# Patient Record
Sex: Male | Born: 1960 | Race: White | Hispanic: No | Marital: Married | Smoking: Current every day smoker
Health system: Southern US, Community
[De-identification: ages and names within clinical notes are randomized; demographics above are authoritative.]

## PROBLEM LIST (undated history)

## (undated) DIAGNOSIS — I10 Essential (primary) hypertension: Secondary | ICD-10-CM

## (undated) DIAGNOSIS — K219 Gastro-esophageal reflux disease without esophagitis: Secondary | ICD-10-CM

## (undated) DIAGNOSIS — Z720 Tobacco use: Secondary | ICD-10-CM

## (undated) DIAGNOSIS — T4145XA Adverse effect of unspecified anesthetic, initial encounter: Secondary | ICD-10-CM

## (undated) DIAGNOSIS — T8859XA Other complications of anesthesia, initial encounter: Secondary | ICD-10-CM

## (undated) DIAGNOSIS — I2510 Atherosclerotic heart disease of native coronary artery without angina pectoris: Secondary | ICD-10-CM

## (undated) DIAGNOSIS — E785 Hyperlipidemia, unspecified: Secondary | ICD-10-CM

## (undated) DIAGNOSIS — E119 Type 2 diabetes mellitus without complications: Secondary | ICD-10-CM

## (undated) DIAGNOSIS — Z9289 Personal history of other medical treatment: Secondary | ICD-10-CM

## (undated) HISTORY — DX: Hyperlipidemia, unspecified: E78.5

## (undated) HISTORY — DX: Personal history of other medical treatment: Z92.89

## (undated) HISTORY — PX: NASAL SEPTUM SURGERY: SHX37

---

## 1998-07-03 ENCOUNTER — Encounter: Admission: RE | Admit: 1998-07-03 | Discharge: 1998-10-01 | Payer: Self-pay | Admitting: Neurosurgery

## 2000-09-11 ENCOUNTER — Encounter: Payer: Self-pay | Admitting: Urology

## 2000-09-11 ENCOUNTER — Encounter: Admission: RE | Admit: 2000-09-11 | Discharge: 2000-09-11 | Payer: Self-pay | Admitting: Urology

## 2000-10-30 ENCOUNTER — Encounter: Admission: RE | Admit: 2000-10-30 | Discharge: 2001-01-28 | Payer: Self-pay | Admitting: *Deleted

## 2003-04-01 ENCOUNTER — Encounter: Admission: RE | Admit: 2003-04-01 | Discharge: 2003-06-30 | Payer: Self-pay | Admitting: Internal Medicine

## 2009-12-05 HISTORY — PX: BACK SURGERY: SHX140

## 2010-02-02 ENCOUNTER — Inpatient Hospital Stay (HOSPITAL_COMMUNITY): Admission: RE | Admit: 2010-02-02 | Discharge: 2010-02-04 | Payer: Self-pay | Admitting: Neurosurgery

## 2011-02-24 LAB — BASIC METABOLIC PANEL
BUN: 8 mg/dL (ref 6–23)
CO2: 29 mEq/L (ref 19–32)
Calcium: 9.4 mg/dL (ref 8.4–10.5)
Chloride: 104 mEq/L (ref 96–112)
Creatinine, Ser: 0.97 mg/dL (ref 0.4–1.5)
GFR calc Af Amer: >60 mL/min (ref >60)
GFR calc non Af Amer: >60 mL/min (ref >60)
Glucose, Bld: 148 mg/dL — ABNORMAL HIGH (ref 70–99)
Potassium: 4.7 mEq/L (ref 3.5–5.1)
Sodium: 140 mEq/L (ref 135–145)

## 2011-02-24 LAB — CBC
HCT: 47.9 % (ref 39.0–52.0)
Hemoglobin: 16.6 g/dL (ref 13.0–17.0)
MCHC: 34.7 g/dL (ref 30.0–36.0)
MCV: 85.6 fL (ref 78.0–100.0)
Platelets: 279 10*3/uL (ref 150–400)
RBC: 5.6 MIL/uL (ref 4.22–5.81)
RDW: 12.6 % (ref 11.5–15.5)
WBC: 7.7 10*3/uL (ref 4.0–10.5)

## 2011-02-24 LAB — SURGICAL PCR SCREEN
MRSA, PCR: NEGATIVE
Staphylococcus aureus: POSITIVE — AB

## 2011-02-28 LAB — GLUCOSE, CAPILLARY
Glucose-Capillary: 139 mg/dL — ABNORMAL HIGH (ref 70–99)
Glucose-Capillary: 208 mg/dL — ABNORMAL HIGH (ref 70–99)
Glucose-Capillary: 221 mg/dL — ABNORMAL HIGH (ref 70–99)
Glucose-Capillary: 240 mg/dL — ABNORMAL HIGH (ref 70–99)
Glucose-Capillary: 255 mg/dL — ABNORMAL HIGH (ref 70–99)
Glucose-Capillary: 271 mg/dL — ABNORMAL HIGH (ref 70–99)
Glucose-Capillary: 305 mg/dL — ABNORMAL HIGH (ref 70–99)

## 2011-03-26 IMAGING — CR DG CERVICAL SPINE 2 OR 3 VIEWS
1 series · 1 of 1 positions shown · non-contrast
Comparison: None.

CLINICAL DATA: Cervical stenosis with neck pain.  C3-4, C5-6, C6-7
ACDF.

CERVICAL SPINE - 2-3 VIEW

[view not recorded]
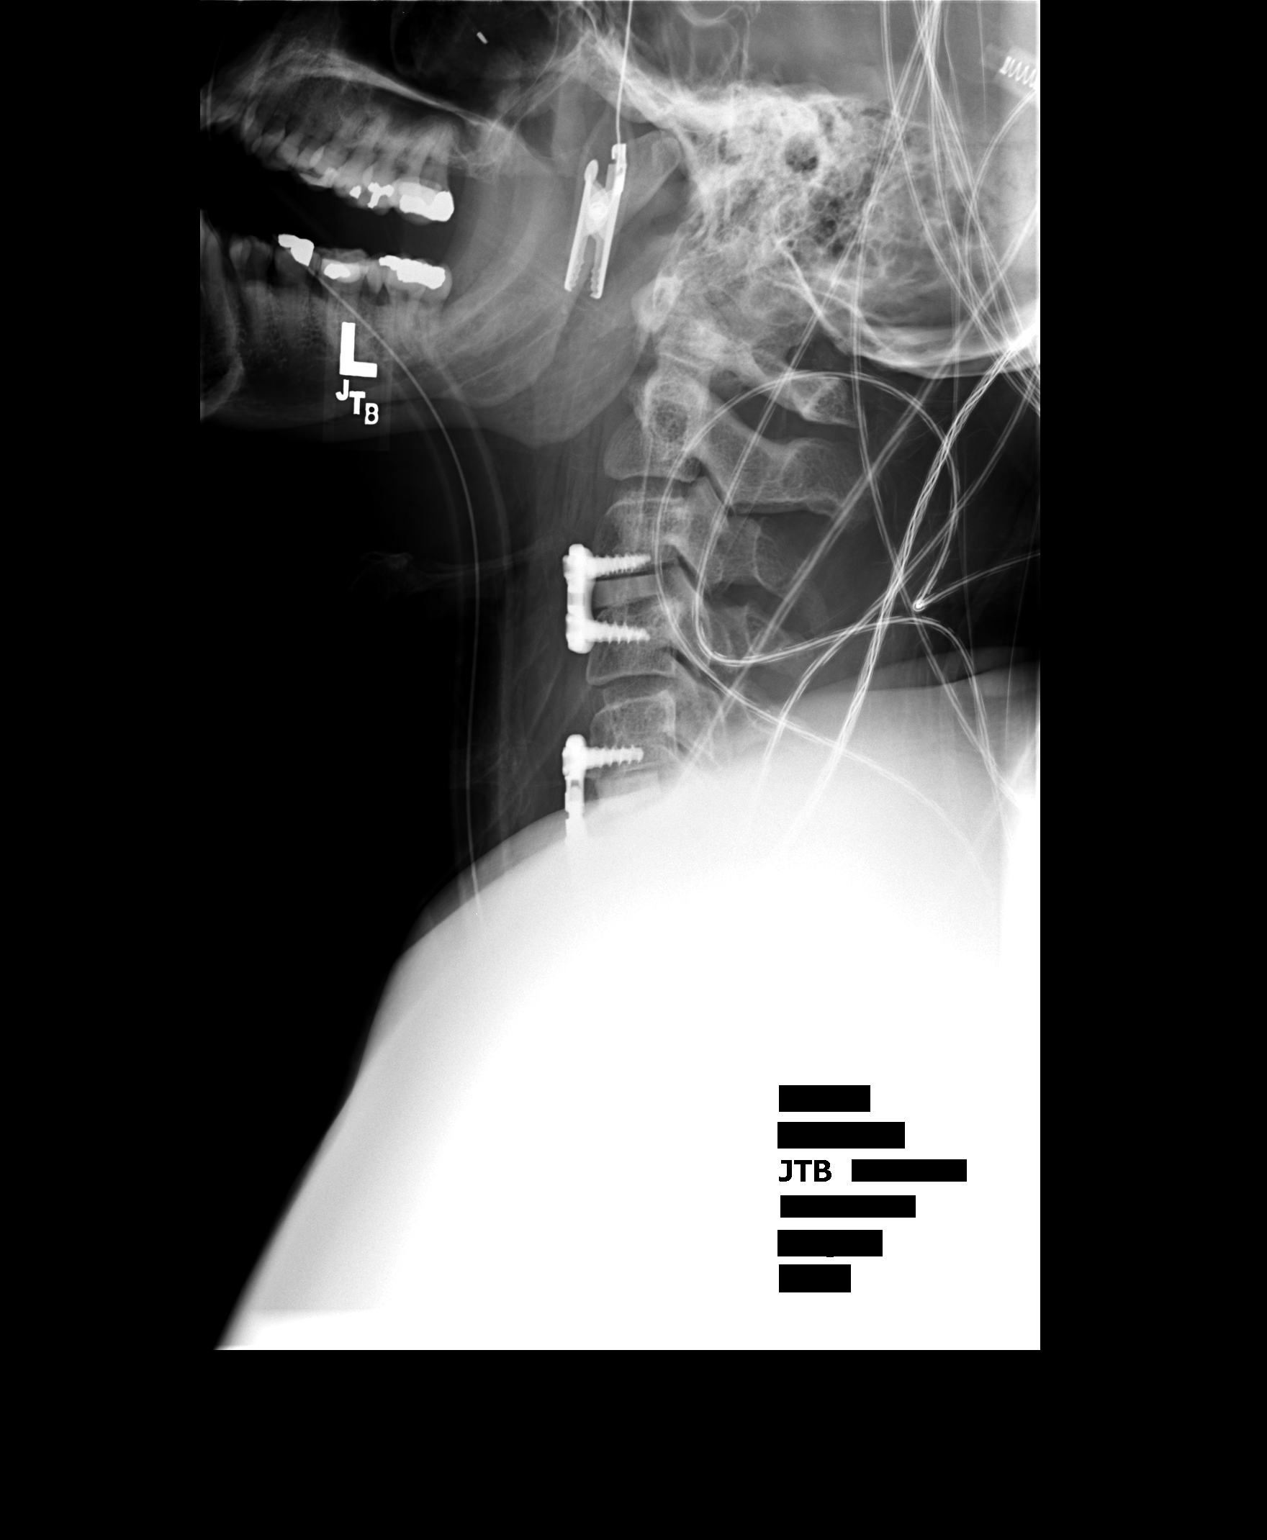

[1 of 1 positions shown; findings below may reference images not displayed]

FINDINGS: Film labeled #1 demonstrates anterior spinal needles at
the anterior C3-4 and C5-6 disc levels.  Film labeled #2
demonstrates interval anterior discectomy interbody bone plugs and
anterior fusion hardware in satisfactory placement  at the C3-4 and
C5-6(C7 hardware and C6-7 interbody bone plug difficult to
visualized due to the shoulder girdle.)
IMPRESSION: Localization and ACDF as described.

## 2014-08-15 ENCOUNTER — Observation Stay (HOSPITAL_BASED_OUTPATIENT_CLINIC_OR_DEPARTMENT_OTHER)
Admission: EM | Admit: 2014-08-15 | Discharge: 2014-08-16 | Disposition: A | Discharge status: Home / Self Care | Payer: 59 | Attending: Internal Medicine | Admitting: Internal Medicine

## 2014-08-15 ENCOUNTER — Emergency Department (HOSPITAL_BASED_OUTPATIENT_CLINIC_OR_DEPARTMENT_OTHER): Payer: 59

## 2014-08-15 ENCOUNTER — Encounter (HOSPITAL_BASED_OUTPATIENT_CLINIC_OR_DEPARTMENT_OTHER): Payer: Self-pay | Admitting: Emergency Medicine

## 2014-08-15 DIAGNOSIS — Z794 Long term (current) use of insulin: Secondary | ICD-10-CM | POA: Diagnosis not present

## 2014-08-15 DIAGNOSIS — Z882 Allergy status to sulfonamides status: Secondary | ICD-10-CM | POA: Diagnosis not present

## 2014-08-15 DIAGNOSIS — Z7982 Long term (current) use of aspirin: Secondary | ICD-10-CM | POA: Diagnosis not present

## 2014-08-15 DIAGNOSIS — E860 Dehydration: Principal | ICD-10-CM | POA: Insufficient documentation

## 2014-08-15 DIAGNOSIS — E1010 Type 1 diabetes mellitus with ketoacidosis without coma: Secondary | ICD-10-CM | POA: Insufficient documentation

## 2014-08-15 DIAGNOSIS — F17200 Nicotine dependence, unspecified, uncomplicated: Secondary | ICD-10-CM | POA: Insufficient documentation

## 2014-08-15 DIAGNOSIS — E785 Hyperlipidemia, unspecified: Secondary | ICD-10-CM | POA: Diagnosis not present

## 2014-08-15 DIAGNOSIS — I10 Essential (primary) hypertension: Secondary | ICD-10-CM | POA: Diagnosis not present

## 2014-08-15 HISTORY — DX: Type 2 diabetes mellitus without complications: E11.9

## 2014-08-15 HISTORY — DX: Essential (primary) hypertension: I10

## 2014-08-15 LAB — COMPREHENSIVE METABOLIC PANEL
ALBUMIN: 4.1 g/dL (ref 3.5–5.2)
ALK PHOS: 136 U/L — AB (ref 39–117)
ALT: 58 U/L — AB (ref 0–53)
AST: 40 U/L — ABNORMAL HIGH (ref 0–37)
Anion gap: 28 — ABNORMAL HIGH (ref 5–15)
BUN: 21 mg/dL (ref 6–23)
CO2: 13 mEq/L — ABNORMAL LOW (ref 19–32)
Calcium: 10.2 mg/dL (ref 8.4–10.5)
Chloride: 91 mEq/L — ABNORMAL LOW (ref 96–112)
Creatinine, Ser: 0.9 mg/dL (ref 0.50–1.35)
GFR calc Af Amer: >90 mL/min (ref >90)
GFR calc non Af Amer: >90 mL/min (ref >90)
GLUCOSE: 417 mg/dL — AB (ref 70–99)
POTASSIUM: 5.5 meq/L — AB (ref 3.7–5.3)
SODIUM: 132 meq/L — AB (ref 137–147)
TOTAL PROTEIN: 8 g/dL (ref 6.0–8.3)
Total Bilirubin: 1.1 mg/dL (ref 0.3–1.2)

## 2014-08-15 LAB — I-STAT VENOUS BLOOD GAS, ED
ACID-BASE DEFICIT: 14 mmol/L — AB (ref 0.0–2.0)
BICARBONATE: 12.4 meq/L — AB (ref 20.0–24.0)
O2 SAT: 93 %
TCO2: 13 mmol/L (ref 0–100)
pCO2, Ven: 29.5 mmHg — ABNORMAL LOW (ref 45.0–50.0)
pH, Ven: 7.232 — ABNORMAL LOW (ref 7.250–7.300)
pO2, Ven: 77 mmHg — ABNORMAL HIGH (ref 30.0–45.0)

## 2014-08-15 LAB — CBC
HEMATOCRIT: 48.1 % (ref 39.0–52.0)
Hemoglobin: 16.6 g/dL (ref 13.0–17.0)
MCH: 30 pg (ref 26.0–34.0)
MCHC: 34.5 g/dL (ref 30.0–36.0)
MCV: 87 fL (ref 78.0–100.0)
PLATELETS: 345 10*3/uL (ref 150–400)
RBC: 5.53 MIL/uL (ref 4.22–5.81)
RDW: 12.6 % (ref 11.5–15.5)
WBC: 15.9 10*3/uL — ABNORMAL HIGH (ref 4.0–10.5)

## 2014-08-15 LAB — CBG MONITORING, ED
GLUCOSE-CAPILLARY: 358 mg/dL — AB (ref 70–99)
Glucose-Capillary: 311 mg/dL — ABNORMAL HIGH (ref 70–99)

## 2014-08-15 LAB — LIPASE, BLOOD: LIPASE: 12 U/L (ref 11–59)

## 2014-08-15 LAB — PROTIME-INR
INR: 0.92 (ref 0.00–1.49)
PROTHROMBIN TIME: 12.4 s (ref 11.6–15.2)

## 2014-08-15 LAB — TROPONIN I

## 2014-08-15 MED ORDER — GI COCKTAIL ~~LOC~~
30.0000 mL | Freq: Once | ORAL | Status: AC
  Administered 2014-08-15: 30 mL via ORAL
  Filled 2014-08-15: qty 30

## 2014-08-15 MED ORDER — MORPHINE SULFATE 4 MG/ML IJ SOLN
4.0000 mg | Freq: Once | INTRAMUSCULAR | Status: AC
  Administered 2014-08-15: 4 mg via INTRAVENOUS
  Filled 2014-08-15: qty 1

## 2014-08-15 MED ORDER — SODIUM CHLORIDE 0.9 % IV SOLN
1000.0000 mL | INTRAVENOUS | Status: DC
  Administered 2014-08-15: 1000 mL via INTRAVENOUS

## 2014-08-15 MED ORDER — SODIUM CHLORIDE 0.9 % IV SOLN
INTRAVENOUS | Status: DC
  Administered 2014-08-15: 3.6 [IU]/h via INTRAVENOUS

## 2014-08-15 MED ORDER — ASPIRIN 81 MG PO CHEW
324.0000 mg | CHEWABLE_TABLET | Freq: Once | ORAL | Status: AC
  Administered 2014-08-15: 324 mg via ORAL
  Filled 2014-08-15: qty 4

## 2014-08-15 NOTE — ED Notes (Signed)
Epigastric pain and hypertension since this am. Took a zantac with no relief.

## 2014-08-15 NOTE — ED Provider Notes (Addendum)
CSN: [id number]     Arrival date & time 08/15/14  1801 History  This chart was scribed for Linwood Dibbles, MD by Carl Best, ED Scribe. This patient was seen in room MH11/MH11 and the patient's care was started at 6:31 PM.     Chief Complaint  Patient presents with  . Chest Pain     Patient is a 53 y.o. male presenting with chest pain. The history is provided by the patient. No language interpreter was used.  Chest Pain Associated symptoms: abdominal pain   Associated symptoms: no nausea, no shortness of breath and not vomiting    HPI Comments: VICTORY STROLLO is a 53 y.o. male who presents to the Emergency Department complaining of constant burning chest pain radiating to his epigastric region reminiscent of indigestion that started at 10:30 AM this morning.  He denies nausea, vomiting, and SOB as associated symptoms.  He took Zantac at 5 PM this evening with no relief to his symptoms.  Nothing aggravates or alleviates the pain.  He has never experienced these symptoms in the past.  He has a history of DM that is controlled with insulin and hypertension.  His last lipid panel was last year but his last A1C was 7.8.  He takes  of Lipitor and a baby aspirin daily.  He recently traveled to [place] during the first week of August.    Past Medical History  Diagnosis Date  . Diabetes mellitus without complication   . Hypertension    Past Surgical History  Procedure Laterality Date  . Back surgery     No family history on file. History  Substance Use Topics  . Smoking status: Current Every Day Smoker  . Smokeless tobacco: Current User    Types: Chew  . Alcohol Use: Yes     Comment: daily    Review of Systems  Respiratory: Negative for shortness of breath.   Cardiovascular: Positive for chest pain.  Gastrointestinal: Positive for abdominal pain. Negative for nausea and vomiting.  All other systems reviewed and are negative.     Allergies  Sulfa antibiotics  Home  Medications   Prior to Admission medications   Medication Sig Start Date End Date Taking? Authorizing Provider  aspirin 81 MG tablet Take 81 mg by mouth daily.   Yes Historical Provider, MD  Atorvastatin Calcium (LIPITOR PO) Take by mouth.   Yes Historical Provider, MD  insulin lispro (HUMALOG) 100 UNIT/ML injection Inject into the skin 3 (three) times daily before meals.   Yes Historical Provider, MD   BP 144/73  Pulse 102  Temp(Src) 98.7 F (37.1 C) (Oral)  Resp 27  Ht 6' (1.829 m)  Wt 222 lb (100.699 kg)  BMI 30.10 kg/m2  SpO2 97%  Physical Exam  Nursing note and vitals reviewed. Constitutional: He appears well-developed and well-nourished. No distress.  HENT:  Head: Normocephalic and atraumatic.  Right Ear: External ear normal.  Left Ear: External ear normal.  Eyes: Conjunctivae are normal. Right eye exhibits no discharge. Left eye exhibits no discharge. No scleral icterus.  Neck: Neck supple. No tracheal deviation present.  Cardiovascular: Regular rhythm and intact distal pulses.  Tachycardia present.   Pulmonary/Chest: Effort normal and breath sounds normal. No stridor. No respiratory distress. He has no wheezes. He has no rales.  Abdominal: Soft. Bowel sounds are normal. He exhibits no distension. There is no tenderness. There is no rebound and no guarding.  Musculoskeletal: He exhibits no edema and no tenderness.  Neurological:  He is alert. He has normal strength. No cranial nerve deficit (no facial droop, extraocular movements intact, no slurred speech) or sensory deficit. He exhibits normal muscle tone. He displays no seizure activity. Coordination normal.  Skin: Skin is warm and dry. No rash noted.  Psychiatric: He has a normal mood and affect.    ED Course  Procedures (including critical care time)  DIAGNOSTIC STUDIES: Oxygen Saturation is 97% on room air, normal by my interpretation.    COORDINATION OF CARE: 6:35 PM- Patient informed of current plan for  treatment and evaluation and agrees with plan at this time.     Labs Review Labs Reviewed  CBC - Abnormal; Notable for the following:    WBC 15.9 (*)    All other components within normal limits  COMPREHENSIVE METABOLIC PANEL - Abnormal; Notable for the following:    Sodium 132 (*)    Potassium 5.5 (*)    Chloride 91 (*)    CO2 13 (*)    Glucose, Bld 417 (*)    AST 40 (*)    ALT 58 (*)    Alkaline Phosphatase 136 (*)    Anion gap 28 (*)    All other components within normal limits  CBG MONITORING, ED - Abnormal; Notable for the following:    Glucose-Capillary 358 (*)    All other components within normal limits  LIPASE, BLOOD  PROTIME-INR  TROPONIN I  BLOOD GAS, VENOUS    Imaging Review Dg Chest 2 View  08/15/2014   CLINICAL DATA:  Chest pain.  EXAM: CHEST  2 VIEW  COMPARISON:  None.  FINDINGS: The heart size and mediastinal contours are within normal limits. Both lungs are clear. The visualized skeletal structures are unremarkable.  IMPRESSION: Normal chest x-ray.   Electronically Signed   By: Loralie Champagne M.D.   On: 08/15/2014 19:18   US Abdomen Complete  08/15/2014   CLINICAL DATA:  Epigastric abdominal pain.  EXAM: ULTRASOUND ABDOMEN COMPLETE  COMPARISON:  None.  FINDINGS: Gallbladder:  No gallstones, wall thickening or pericholecystic fluid. Negative sonographic Murphy sign.  Common bile duct:  Diameter: 2.3 mm  Liver:  There is diffuse increased echogenicity of the liver and decreased through transmission consistent with fatty infiltration. No focal lesions or biliary dilatation.  IVC:  No abnormality visualized.  Pancreas:  Not well visualized due to overlying bowel gas.  Spleen:  Normal size and echogenicity without focal lesions.  Right Kidney:  Length: 11.2 cm. Small midpole cyst is noted. Normal renal cortical thickness and echogenicity without hydronephrosis.  Left Kidney:  Length: 12.2 cm. Echogenicity within normal limits. No mass or hydronephrosis visualized.   Abdominal aorta:  No aneurysm visualized.  Other findings:  None.  IMPRESSION: 1. Normal gallbladder and normal caliber common bile duct. 2. Diffuse fatty infiltration of the liver. 3. Poor visualization of the pancreas.   Electronically Signed   By: Loralie Champagne M.D.   On: 08/15/2014 20:58     EKG Interpretation   Date/Time:  Friday August 15 2014 18:22:02 EDT Ventricular Rate:  106 PR Interval:  142 QRS Duration: 78 QT Interval:  326 QTC Calculation: 433 R Axis:   66 Text Interpretation:  Sinus tachycardia Otherwise normal ECG No old  tracing to compare Confirmed by Lynnix Schoneman  MD-J, Heather Mckendree ([id number]) on 08/15/2014  6:26:54 PM     Medications  0.9 %  sodium chloride infusion (1,000 mLs Intravenous New Bag/Given 08/15/14 2104)  insulin regular (NOVOLIN R,HUMULIN R) 250 Units in  sodium chloride 0.9 % 250 mL (1 Units/mL) infusion (3.6 Units/hr Intravenous New Bag/Given 08/15/14 2105)  aspirin chewable tablet 324 mg (324 mg Oral Given 08/15/14 1847)  gi cocktail (Maalox,Lidocaine,Donnatal) (30 mLs Oral Given 08/15/14 1847)  morphine 4 MG/ML injection 4 mg (4 mg Intravenous Given 08/15/14 2126)    MDM   Final diagnoses:  Diabetic ketoacidosis without coma associated with type 1 diabetes mellitus    Patient's laboratory to show an anion gap metabolic acidosis. The patient is a type I diabetic and is hyperglycemic. The patient realized when he was taking his pump off here that it  was possibly leaking today.  Signs of acute cardiac ischemia at this time.  Ultrasound does not show any gallbladder disease.  Patient has been started on IV fluids and insulin drip. I will consult with his primary doctor regarding admission for further treatment  I personally performed the services described in this documentation, which was scribed in my presence.  The recorded information has been reviewed and is accurate.    Linwood Dibbles, MD 08/15/14 2159  D/w Dr Jarold Motto.  Will transfer to [facility] stepdown  Linwood Dibbles, MD 08/15/14 2226

## 2014-08-15 NOTE — ED Notes (Signed)
Patient transported to X-ray 

## 2014-08-15 NOTE — ED Notes (Signed)
Patient transported to Ultrasound 

## 2014-08-15 NOTE — ED Notes (Signed)
Pt turned insulin pump off.

## 2014-08-16 ENCOUNTER — Encounter (HOSPITAL_COMMUNITY): Payer: Self-pay | Admitting: Internal Medicine

## 2014-08-16 DIAGNOSIS — E860 Dehydration: Principal | ICD-10-CM | POA: Diagnosis present

## 2014-08-16 LAB — BASIC METABOLIC PANEL
ANION GAP: 20 — AB (ref 5–15)
ANION GAP: 14 (ref 5–15)
BUN: 21 mg/dL (ref 6–23)
BUN: 20 mg/dL (ref 6–23)
CHLORIDE: 96 meq/L (ref 96–112)
CO2: 15 meq/L — AB (ref 19–32)
CO2: 19 mEq/L (ref 19–32)
Calcium: 9.2 mg/dL (ref 8.4–10.5)
Calcium: 9.1 mg/dL (ref 8.4–10.5)
Chloride: 101 mEq/L (ref 96–112)
Creatinine, Ser: 0.9 mg/dL (ref 0.50–1.35)
Creatinine, Ser: 0.87 mg/dL (ref 0.50–1.35)
GFR calc Af Amer: >90 mL/min (ref >90)
GFR calc non Af Amer: >90 mL/min (ref >90)
Glucose, Bld: 273 mg/dL — ABNORMAL HIGH (ref 70–99)
Glucose, Bld: 215 mg/dL — ABNORMAL HIGH (ref 70–99)
POTASSIUM: 4.7 meq/L (ref 3.7–5.3)
Potassium: 4.8 mEq/L (ref 3.7–5.3)
SODIUM: 134 meq/L — AB (ref 137–147)
Sodium: 131 mEq/L — ABNORMAL LOW (ref 137–147)

## 2014-08-16 LAB — TROPONIN I: Troponin I: <0.3 ng/mL (ref <0.30)

## 2014-08-16 LAB — CBC
HCT: 44 % (ref 39.0–52.0)
Hemoglobin: 15.3 g/dL (ref 13.0–17.0)
MCH: 30.2 pg (ref 26.0–34.0)
MCHC: 34.8 g/dL (ref 30.0–36.0)
MCV: 87 fL (ref 78.0–100.0)
PLATELETS: 311 10*3/uL (ref 150–400)
RBC: 5.06 MIL/uL (ref 4.22–5.81)
RDW: 12.3 % (ref 11.5–15.5)
WBC: 13.9 10*3/uL — AB (ref 4.0–10.5)

## 2014-08-16 LAB — GLUCOSE, CAPILLARY
GLUCOSE-CAPILLARY: 283 mg/dL — AB (ref 70–99)
GLUCOSE-CAPILLARY: 217 mg/dL — AB (ref 70–99)
GLUCOSE-CAPILLARY: 89 mg/dL (ref 70–99)
Glucose-Capillary: 261 mg/dL — ABNORMAL HIGH (ref 70–99)
Glucose-Capillary: 263 mg/dL — ABNORMAL HIGH (ref 70–99)
Glucose-Capillary: 282 mg/dL — ABNORMAL HIGH (ref 70–99)
Glucose-Capillary: 112 mg/dL — ABNORMAL HIGH (ref 70–99)
Glucose-Capillary: 181 mg/dL — ABNORMAL HIGH (ref 70–99)
Glucose-Capillary: 78 mg/dL (ref 70–99)

## 2014-08-16 LAB — MRSA PCR SCREENING: MRSA by PCR: NEGATIVE

## 2014-08-16 MED ORDER — ENOXAPARIN SODIUM 40 MG/0.4ML ~~LOC~~ SOLN
40.0000 mg | SUBCUTANEOUS | Status: DC
  Administered 2014-08-16: 40 mg via SUBCUTANEOUS
  Filled 2014-08-16: qty 0.4

## 2014-08-16 MED ORDER — INSULIN ASPART 100 UNIT/ML ~~LOC~~ SOLN: 0.0000 [IU] | Freq: Every day | SUBCUTANEOUS | Status: DC

## 2014-08-16 MED ORDER — ASPIRIN 81 MG PO CHEW
81.0000 mg | CHEWABLE_TABLET | Freq: Every day | ORAL | Status: DC
  Administered 2014-08-16: 81 mg via ORAL
  Filled 2014-08-16: qty 1

## 2014-08-16 MED ORDER — SODIUM CHLORIDE 0.9 % IV SOLN
INTRAVENOUS | Status: DC
  Administered 2014-08-16: 6.7 [IU]/h via INTRAVENOUS
  Filled 2014-08-16: qty 2.5

## 2014-08-16 MED ORDER — SODIUM CHLORIDE 0.9 % IV SOLN: INTRAVENOUS | Status: DC

## 2014-08-16 MED ORDER — INSULIN ASPART 100 UNIT/ML ~~LOC~~ SOLN
0.0000 [IU] | Freq: Three times a day (TID) | SUBCUTANEOUS | Status: DC
  Administered 2014-08-16: 8 [IU] via SUBCUTANEOUS

## 2014-08-16 MED ORDER — INSULIN DETEMIR 100 UNIT/ML ~~LOC~~ SOLN: SUBCUTANEOUS | Status: DC

## 2014-08-16 MED ORDER — POTASSIUM CHLORIDE 10 MEQ/100ML IV SOLN: 10.0000 meq | INTRAVENOUS | Status: AC

## 2014-08-16 MED ORDER — DEXTROSE-NACL 5-0.45 % IV SOLN
INTRAVENOUS | Status: DC
  Administered 2014-08-16: 05:00:00 via INTRAVENOUS

## 2014-08-16 MED ORDER — INSULIN GLARGINE 100 UNIT/ML ~~LOC~~ SOLN
10.0000 [IU] | Freq: Every day | SUBCUTANEOUS | Status: DC
  Administered 2014-08-16: 10 [IU] via SUBCUTANEOUS
  Filled 2014-08-16: qty 0.1

## 2014-08-16 MED ORDER — DEXTROSE 50 % IV SOLN: 25.0000 mL | INTRAVENOUS | Status: DC | PRN

## 2014-08-16 NOTE — Discharge Summary (Addendum)
Physician Discharge Summary  Patient ID: Ian Houston MRN: [id number] DOB/AGE: 01/01/1961 53 y.o.  Admit date: 08/15/2014 Discharge date: 08/16/2014   Discharge Diagnoses:  Principal Problem:   DKA (diabetic ketoacidoses) Active Problems:   Dehydration   Discharged Condition: good  Hospital Course: The patient is a 53 year old man with DM1 on an insulin pump who today developed epigastric pain associated with increased pulse and blood pressure and blood glucose level (nearly 400), so he presented to the ER for evaluation. Workup showed DKA with a high anion gap acidosis, so treatment with IV insulin and IVF was initiated in the ER and he is being admitted for further evaluation. He no longer has the epigastric pain and has not had substernal chest discomfort or dyspnea.   He was treated with high-dose IV fluids and IV insulin and did well with this with resolution of epigastric discomfort and normalization of his high anion gap ketoacidosis. In the morning he felt fine and was able to eat regular food without any difficulty whatsoever. He had no problems with shortness breath, nausea, abdominal pain, chest discomfort, or fever. It appeared that his pump had no function at the infusion set injection site and was working fine at the time of discharge.    Con fever.sults: None  Significant Diagnostic Studies:  Dg Chest 2 View  08/15/2014   CLINICAL DATA:  Chest pain.  EXAM: CHEST  2 VIEW  COMPARISON:  None.  FINDINGS: The heart size and mediastinal contours are within normal limits. Both lungs are clear. The visualized skeletal structures are unremarkable.  IMPRESSION: Normal chest x-ray.   Electronically Signed   By: Loralie Champagne M.D.   On: 08/15/2014 19:18   US Abdomen Complete  08/15/2014   CLINICAL DATA:  Epigastric abdominal pain.  EXAM: ULTRASOUND ABDOMEN COMPLETE  COMPARISON:  None.  FINDINGS: Gallbladder:  No gallstones, wall thickening or pericholecystic fluid. Negative sonographic  Murphy sign.  Common bile duct:  Diameter: 2.3 mm  Liver:  There is diffuse increased echogenicity of the liver and decreased through transmission consistent with fatty infiltration. No focal lesions or biliary dilatation.  IVC:  No abnormality visualized.  Pancreas:  Not well visualized due to overlying bowel gas.  Spleen:  Normal size and echogenicity without focal lesions.  Right Kidney:  Length: 11.2 cm. Small midpole cyst is noted. Normal renal cortical thickness and echogenicity without hydronephrosis.  Left Kidney:  Length: 12.2 cm. Echogenicity within normal limits. No mass or hydronephrosis visualized.  Abdominal aorta:  No aneurysm visualized.  Other findings:  None.  IMPRESSION: 1. Normal gallbladder and normal caliber common bile duct. 2. Diffuse fatty infiltration of the liver. 3. Poor visualization of the pancreas.   Electronically Signed   By: Loralie Champagne M.D.   On: 08/15/2014 20:58    Labs: Lab Results  Component Value Date   WBC 13.9* 08/16/2014   HGB 15.3 08/16/2014   HCT 44.0 08/16/2014   MCV 87.0 08/16/2014   PLT 311 08/16/2014     Recent Labs Lab 08/15/14 1900  08/16/14 0512  NA 132*  < > 134*  K 5.5*  < > 4.7  CL 91*  < > 101  CO2 13*  < > 19  BUN 21  < > 20  CREATININE 0.90  < > 0.87  CALCIUM 10.2  < > 9.1  PROT 8.0  --   --   BILITOT 1.1  --   --   ALKPHOS 136*  --   --  ALT 58*  --   --   AST 40*  --   --   GLUCOSE 417*  < > 215*  < > = values in this interval not displayed.     Lab Results  Component Value Date   INR 0.92 08/15/2014     No results found for this or any previous visit (from the past 240 hour(s)).    Discharge Exam: Blood pressure 123/68, pulse 85, temperature 98.7 F (37.1 C), temperature source Oral, resp. rate 19, height  (1.854 m), weight 98.8 kg (217 lb 13 oz), SpO2 98.00%.  Physical Exam: in general, he is a well-nourished well-developed white man who was in no apparent distress while lying partially upright in bed. HEENT  exam was within normal limits, neck was supple without jugular venous distention, chest was clear to auscultation, heart had a regular rate and rhythm, abdomen had normal bowel sounds no tenderness, extremities were without cyanosis, clubbing, or edema.   Disposition: He'll be discharged to home and will continue to use his insulin pump her usual. He was also given a prescription for Lantus insulin to take at 30 units daily should his pump fail. He should call our office to schedule a followup visit this coming week.     Discharge Instructions   Call MD for:    Complete by:  As directed   Call for sugars consistently over 300, fever, chills, nausea, chest pain, or other concerning symptoms     Diet - low sodium heart healthy    Complete by:  As directed      Discharge instructions    Complete by:  As directed   Resume usual insulin dosing via your pump. If the pump fails then use levemir insulin at 20 units daily     Increase activity slowly    Complete by:  As directed             Medication List         aspirin 81 MG chewable tablet  Chew 81 mg by mouth daily.     atorvastatin 40 MG tablet  Commonly known as:  LIPITOR  Take 40 mg by mouth daily.     insulin detemir 100 UNIT/ML injection  Commonly known as:  LEVEMIR  Inject 30 units into the skin once daily if the insulin pump fails     insulin pump Soln  Inject into the skin every hour as needed. humalog         Signed: Diyana Starrett G 08/16/2014, 12:50 PM

## 2014-08-16 NOTE — H&P (Signed)
Ian Houston is an 53 y.o. male.   Chief Complaint: abdominal pain HPI:  The patient is a 53 year old man with DM1 on an insulin pump who today developed epigastric pain associated with increased pulse and blood pressure and blood glucose level (nearly 400), so he presented to the ER for evaluation. Workup showed DKA with a high anion gap acidosis, so treatment with IV insulin and IVF was initiated in the ER and he is being admitted for further evaluation. He no longer has the epigastric pain and has not had substernal chest discomfort or dyspnea.  Past Medical History  Diagnosis Date  . Diabetes mellitus without complication   . Hypertension     Medications Prior to Admission  Medication Sig Dispense Refill  . aspirin 81 MG chewable tablet Chew 81 mg by mouth daily.      Marland Kitchen atorvastatin (LIPITOR) 40 MG tablet Take 40 mg by mouth daily.      . Insulin Human (INSULIN PUMP) SOLN Inject into the skin every hour as needed. humalog        ADDITIONAL HOME MEDICATIONS: no additional meds  PHYSICIANS INVOLVED IN CARE: Shon Baton (PCP)  Past Surgical History  Procedure Laterality Date  . Back surgery      History reviewed. No pertinent family history.   Social History:  reports that he has been smoking.  His smokeless tobacco use includes Chew. He reports that he drinks alcohol. His drug history is not on file.  Allergies:  Allergies  Allergen Reactions  . Sulfa Antibiotics     Flu like symptoms     ROS: diabetes and high blood pressure  PHYSICAL EXAM: Blood pressure 137/73, pulse 96, temperature 98.7 F (37.1 C), temperature source Oral, resp. rate 22, height _0  (1.854 m), weight 98.8 kg (217 lb 13 oz), SpO2 95.00%. In general the patient is a well nourished and well developed white man who was in no apparent distress. HEENT  Exam was normal, neck was without JVD or bruit, chest was clear to auscultation, heart had a regular rhythm without murmur, abdomen had normal bowel  sounds and no tenderness, extremities were with normal pulses and no edema, and neurological exam was nonfocal.   Results for orders placed during the hospital encounter of 08/15/14 (from the past 48 hour(s))  CBC     Status: Abnormal   Collection Time    08/15/14  6:35 PM      Result Value Ref Range   WBC 15.9 (*) 4.0 - 10.5 K/uL   RBC 5.53  4.22 - 5.81 MIL/uL   Hemoglobin 16.6  13.0 - 17.0 g/dL   HCT 48.1  39.0 - 52.0 %   MCV 87.0  78.0 - 100.0 fL   MCH 30.0  26.0 - 34.0 pg   MCHC 34.5  30.0 - 36.0 g/dL   RDW 12.6  11.5 - 15.5 %   Platelets 345  150 - 400 K/uL  COMPREHENSIVE METABOLIC PANEL     Status: Abnormal   Collection Time    08/15/14  7:00 PM      Result Value Ref Range   Sodium 132 (*) 137 - 147 mEq/L   Potassium 5.5 (*) 3.7 - 5.3 mEq/L   Chloride 91 (*) 96 - 112 mEq/L   CO2 13 (*) 19 - 32 mEq/L   Glucose, Bld 417 (*) 70 - 99 mg/dL   BUN 21  6 - 23 mg/dL   Creatinine, Ser 0.90  0.50 -  1.35 mg/dL   Calcium 10.2  8.4 - 10.5 mg/dL   Total Protein 8.0  6.0 - 8.3 g/dL   Albumin 4.1  3.5 - 5.2 g/dL   AST 40 (*) 0 - 37 U/L   ALT 58 (*) 0 - 53 U/L   Alkaline Phosphatase 136 (*) 39 - 117 U/L   Total Bilirubin 1.1  0.3 - 1.2 mg/dL   GFR calc non Af Amer >90  >90 mL/min   GFR calc Af Amer >90  >90 mL/min   Comment: (NOTE)     The eGFR has been calculated using the CKD EPI equation.     This calculation has not been validated in all clinical situations.     eGFR's persistently <90 mL/min signify possible Chronic Kidney     Disease.   Anion gap 28 (*) 5 - 15  LIPASE, BLOOD     Status: None   Collection Time    08/15/14  7:00 PM      Result Value Ref Range   Lipase 12  11 - 59 U/L  PROTIME-INR     Status: None   Collection Time    08/15/14  7:00 PM      Result Value Ref Range   Prothrombin Time 12.4  11.6 - 15.2 seconds   INR 0.92  0.00 - 1.49   Comment: Performed at [facility]  TROPONIN I     Status: None   Collection Time    08/15/14  7:00 PM       Result Value Ref Range   Troponin I <0.30  <0.30 ng/mL   Comment:            Due to the release kinetics of cTnI,     a negative result within the first hours     of the onset of symptoms does not rule out     myocardial infarction with certainty.     If myocardial infarction is still suspected,     repeat the test at appropriate intervals.  CBG MONITORING, ED     Status: Abnormal   Collection Time    08/15/14  9:55 PM      Result Value Ref Range   Glucose-Capillary 358 (*) 70 - 99 mg/dL  I-STAT VENOUS BLOOD GAS, ED     Status: Abnormal   Collection Time    08/15/14  9:59 PM      Result Value Ref Range   pH, Ven 7.232 (*) 7.250 - 7.300   pCO2, Ven 29.5 (*) 45.0 - 50.0 mmHg   pO2, Ven 77.0 (*) 30.0 - 45.0 mmHg   Bicarbonate 12.4 (*) 20.0 - 24.0 mEq/L   TCO2 13  0 - 100 mmol/L   O2 Saturation 93.0     Acid-base deficit 14.0 (*) 0.0 - 2.0 mmol/L   Patient temperature 98.6 F     Collection site IV START     Drawn by Nurse     Sample type VENOUS    CBG MONITORING, ED     Status: Abnormal   Collection Time    08/15/14 10:59 PM      Result Value Ref Range   Glucose-Capillary 311 (*) 70 - 99 mg/dL   Dg Chest 2 View  08/15/2014   CLINICAL DATA:  Chest pain.  EXAM: CHEST  2 VIEW  COMPARISON:  None.  FINDINGS: The heart size and mediastinal contours are within normal limits. Both lungs are clear. The visualized skeletal structures are unremarkable.  IMPRESSION: Normal chest x-ray.   Electronically Signed   By: Kalman Jewels M.D.   On: 08/15/2014 19:18   US Abdomen Complete  08/15/2014   CLINICAL DATA:  Epigastric abdominal pain.  EXAM: ULTRASOUND ABDOMEN COMPLETE  COMPARISON:  None.  FINDINGS: Gallbladder:  No gallstones, wall thickening or pericholecystic fluid. Negative sonographic Murphy sign.  Common bile duct:  Diameter: 2.3 mm  Liver:  There is diffuse increased echogenicity of the liver and decreased through transmission consistent with fatty infiltration. No focal lesions or  biliary dilatation.  IVC:  No abnormality visualized.  Pancreas:  Not well visualized due to overlying bowel gas.  Spleen:  Normal size and echogenicity without focal lesions.  Right Kidney:  Length: 11.2 cm. Small midpole cyst is noted. Normal renal cortical thickness and echogenicity without hydronephrosis.  Left Kidney:  Length: 12.2 cm. Echogenicity within normal limits. No mass or hydronephrosis visualized.  Abdominal aorta:  No aneurysm visualized.  Other findings:  None.  IMPRESSION: 1. Normal gallbladder and normal caliber common bile duct. 2. Diffuse fatty infiltration of the liver. 3. Poor visualization of the pancreas.   Electronically Signed   By: Kalman Jewels M.D.   On: 08/15/2014 20:58   Troponin I was less than 0.3 EKG showed: sinus tachycardia (106) otherwise normal  Assessment/Plan #1 Diabetic ketoacidosis:  Resolving with IVF and IV insulin and apparently due to an insulin pump malfunction. We will continue IV insulin and IVF and follow his BMET closely, likely transition to insulin via SQ injections later today if ketoacidosis resolved, and discharge to home. #2 Hyperlipidemia: stable on lipitor which will be restarted with discharge. #3 Epigastric pain: likely due to DKA and we will recheck a troponin I level later this morning   Salik Grewell G 08/16/2014, 1:49 AM

## 2014-08-16 NOTE — Progress Notes (Signed)
Ian Harman, MD paged of pt arrival.

## 2014-08-16 NOTE — Progress Notes (Signed)
UR completed 

## 2014-08-16 NOTE — Progress Notes (Signed)
Dr. Eloise Harman in to evaluate pt.

## 2014-08-16 NOTE — Progress Notes (Signed)
Eloise Harman, MD made aware of pt lab results will recheck BMET at 5 am per MD orders.

## 2014-08-16 NOTE — Progress Notes (Signed)
Reviewed discharge instructions with pt and wife. Discharge summary given to pt.   Loletta Parish Student Nurse  Amy Debroah Loop RN

## 2014-08-16 NOTE — Progress Notes (Signed)
Pt received from [facility] via stretcher, Alert and Oriented, denies pain, pt has insulin drip infusing and NS at kvo infusing.  Will inform Tri[facility], NP of pt arrival.

## 2014-08-16 NOTE — Progress Notes (Signed)
Triad 1 paged of pt arrival.

## 2014-08-18 LAB — GLUCOSE, CAPILLARY
Glucose-Capillary: 92 mg/dL (ref 70–99)
Glucose-Capillary: 280 mg/dL — ABNORMAL HIGH (ref 70–99)

## 2014-09-25 ENCOUNTER — Inpatient Hospital Stay (HOSPITAL_COMMUNITY)
Admission: EM | Admit: 2014-09-25 | Discharge: 2014-09-30 | DRG: 981 | Disposition: A | Discharge status: Home / Self Care | Payer: 59 | Attending: Internal Medicine | Admitting: Internal Medicine

## 2014-09-25 ENCOUNTER — Emergency Department (HOSPITAL_COMMUNITY): Payer: 59

## 2014-09-25 ENCOUNTER — Encounter (HOSPITAL_COMMUNITY): Payer: Self-pay | Admitting: Emergency Medicine

## 2014-09-25 DIAGNOSIS — Z7982 Long term (current) use of aspirin: Secondary | ICD-10-CM | POA: Diagnosis not present

## 2014-09-25 DIAGNOSIS — Z716 Tobacco abuse counseling: Secondary | ICD-10-CM | POA: Diagnosis not present

## 2014-09-25 DIAGNOSIS — R6510 Systemic inflammatory response syndrome (SIRS) of non-infectious origin without acute organ dysfunction: Secondary | ICD-10-CM | POA: Diagnosis present

## 2014-09-25 DIAGNOSIS — E875 Hyperkalemia: Secondary | ICD-10-CM | POA: Diagnosis present

## 2014-09-25 DIAGNOSIS — Z881 Allergy status to other antibiotic agents status: Secondary | ICD-10-CM

## 2014-09-25 DIAGNOSIS — A419 Sepsis, unspecified organism: Secondary | ICD-10-CM

## 2014-09-25 DIAGNOSIS — B3781 Candidal esophagitis: Secondary | ICD-10-CM

## 2014-09-25 DIAGNOSIS — T85694A Other mechanical complication of insulin pump, initial encounter: Secondary | ICD-10-CM | POA: Diagnosis present

## 2014-09-25 DIAGNOSIS — I10 Essential (primary) hypertension: Secondary | ICD-10-CM | POA: Diagnosis present

## 2014-09-25 DIAGNOSIS — Z794 Long term (current) use of insulin: Secondary | ICD-10-CM | POA: Diagnosis not present

## 2014-09-25 DIAGNOSIS — Z79899 Other long term (current) drug therapy: Secondary | ICD-10-CM

## 2014-09-25 DIAGNOSIS — I214 Non-ST elevation (NSTEMI) myocardial infarction: Secondary | ICD-10-CM | POA: Diagnosis present

## 2014-09-25 DIAGNOSIS — F17210 Nicotine dependence, cigarettes, uncomplicated: Secondary | ICD-10-CM | POA: Diagnosis present

## 2014-09-25 DIAGNOSIS — N179 Acute kidney failure, unspecified: Secondary | ICD-10-CM

## 2014-09-25 DIAGNOSIS — B370 Candidal stomatitis: Secondary | ICD-10-CM

## 2014-09-25 DIAGNOSIS — R112 Nausea with vomiting, unspecified: Secondary | ICD-10-CM

## 2014-09-25 DIAGNOSIS — Z955 Presence of coronary angioplasty implant and graft: Secondary | ICD-10-CM

## 2014-09-25 DIAGNOSIS — Z9641 Presence of insulin pump (external) (internal): Secondary | ICD-10-CM | POA: Diagnosis present

## 2014-09-25 DIAGNOSIS — E785 Hyperlipidemia, unspecified: Secondary | ICD-10-CM | POA: Diagnosis present

## 2014-09-25 DIAGNOSIS — I2510 Atherosclerotic heart disease of native coronary artery without angina pectoris: Secondary | ICD-10-CM | POA: Diagnosis present

## 2014-09-25 DIAGNOSIS — E1010 Type 1 diabetes mellitus with ketoacidosis without coma: Principal | ICD-10-CM

## 2014-09-25 DIAGNOSIS — I2489 Other forms of acute ischemic heart disease: Secondary | ICD-10-CM

## 2014-09-25 DIAGNOSIS — Z23 Encounter for immunization: Secondary | ICD-10-CM | POA: Diagnosis not present

## 2014-09-25 DIAGNOSIS — I248 Other forms of acute ischemic heart disease: Secondary | ICD-10-CM | POA: Diagnosis present

## 2014-09-25 DIAGNOSIS — G9341 Metabolic encephalopathy: Secondary | ICD-10-CM | POA: Diagnosis present

## 2014-09-25 DIAGNOSIS — I209 Angina pectoris, unspecified: Secondary | ICD-10-CM

## 2014-09-25 DIAGNOSIS — R079 Chest pain, unspecified: Secondary | ICD-10-CM

## 2014-09-25 DIAGNOSIS — E860 Dehydration: Secondary | ICD-10-CM | POA: Diagnosis present

## 2014-09-25 DIAGNOSIS — R9431 Abnormal electrocardiogram [ECG] [EKG]: Secondary | ICD-10-CM

## 2014-09-25 DIAGNOSIS — E872 Acidosis, unspecified: Secondary | ICD-10-CM

## 2014-09-25 HISTORY — DX: Atherosclerotic heart disease of native coronary artery without angina pectoris: I25.10

## 2014-09-25 HISTORY — DX: Tobacco use: Z72.0

## 2014-09-25 LAB — CBC WITH DIFFERENTIAL/PLATELET
BASOS ABS: 0.1 10*3/uL (ref 0.0–0.1)
Basophils Relative: 0 % (ref 0–1)
Eosinophils Absolute: 0 10*3/uL (ref 0.0–0.7)
Eosinophils Relative: 0 % (ref 0–5)
HEMATOCRIT: 50 % (ref 39.0–52.0)
HEMOGLOBIN: 15.8 g/dL (ref 13.0–17.0)
LYMPHS ABS: 2.2 10*3/uL (ref 0.7–4.0)
LYMPHS PCT: 6 % — AB (ref 12–46)
MCH: 29.9 pg (ref 26.0–34.0)
MCHC: 31.6 g/dL (ref 30.0–36.0)
MCV: 94.7 fL (ref 78.0–100.0)
MONO ABS: 2.8 10*3/uL — AB (ref 0.1–1.0)
MONOS PCT: 8 % (ref 3–12)
NEUTROS ABS: 30.5 10*3/uL — AB (ref 1.7–7.7)
Neutrophils Relative %: 86 % — ABNORMAL HIGH (ref 43–77)
Platelets: 436 10*3/uL — ABNORMAL HIGH (ref 150–400)
RBC: 5.28 MIL/uL (ref 4.22–5.81)
RDW: 12.5 % (ref 11.5–15.5)
WBC Morphology: INCREASED
WBC: 35.6 10*3/uL — AB (ref 4.0–10.5)

## 2014-09-25 LAB — GLUCOSE, CAPILLARY
Glucose-Capillary: 597 mg/dL (ref 70–99)
Glucose-Capillary: >600 mg/dL (ref 70–99)

## 2014-09-25 LAB — COMPREHENSIVE METABOLIC PANEL
ALT: 64 U/L — ABNORMAL HIGH (ref 0–53)
AST: 41 U/L — ABNORMAL HIGH (ref 0–37)
Albumin: 3.6 g/dL (ref 3.5–5.2)
Alkaline Phosphatase: 123 U/L — ABNORMAL HIGH (ref 39–117)
BILIRUBIN TOTAL: 0.4 mg/dL (ref 0.3–1.2)
BUN: 36 mg/dL — AB (ref 6–23)
CHLORIDE: 92 meq/L — AB (ref 96–112)
CREATININE: 1.43 mg/dL — AB (ref 0.50–1.35)
Calcium: 8.9 mg/dL (ref 8.4–10.5)
GFR calc non Af Amer: 55 mL/min — ABNORMAL LOW (ref >90)
GFR, EST AFRICAN AMERICAN: 63 mL/min — AB (ref >90)
GLUCOSE: 735 mg/dL — AB (ref 70–99)
Potassium: >7.7 mEq/L (ref 3.7–5.3)
Sodium: 132 mEq/L — ABNORMAL LOW (ref 137–147)
Total Protein: 7.2 g/dL (ref 6.0–8.3)

## 2014-09-25 LAB — I-STAT CHEM 8, ED
BUN: 53 mg/dL — ABNORMAL HIGH (ref 6–23)
CALCIUM ION: 1.04 mmol/L — AB (ref 1.12–1.23)
CHLORIDE: 106 meq/L (ref 96–112)
Creatinine, Ser: 1.6 mg/dL — ABNORMAL HIGH (ref 0.50–1.35)
Glucose, Bld: >700 mg/dL (ref 70–99)
HCT: 49 % (ref 39.0–52.0)
HEMOGLOBIN: 16.7 g/dL (ref 13.0–17.0)
Potassium: 8.3 mEq/L (ref 3.7–5.3)
Sodium: 125 mEq/L — ABNORMAL LOW (ref 137–147)
TCO2: 7 mmol/L (ref 0–100)

## 2014-09-25 LAB — BASIC METABOLIC PANEL
Anion gap: 31 — ABNORMAL HIGH (ref 5–15)
BUN: 36 mg/dL — AB (ref 6–23)
CALCIUM: 8.6 mg/dL (ref 8.4–10.5)
CO2: 7 mEq/L — CL (ref 19–32)
CREATININE: 1.51 mg/dL — AB (ref 0.50–1.35)
Chloride: 94 mEq/L — ABNORMAL LOW (ref 96–112)
GFR, EST AFRICAN AMERICAN: 59 mL/min — AB (ref >90)
GFR, EST NON AFRICAN AMERICAN: 51 mL/min — AB (ref >90)
GLUCOSE: 646 mg/dL — AB (ref 70–99)
Potassium: 6.2 mEq/L — ABNORMAL HIGH (ref 3.7–5.3)
Sodium: 132 mEq/L — ABNORMAL LOW (ref 137–147)

## 2014-09-25 LAB — CBG MONITORING, ED
Glucose-Capillary: >600 mg/dL (ref 70–99)
Glucose-Capillary: >600 mg/dL (ref 70–99)
Glucose-Capillary: >600 mg/dL (ref 70–99)

## 2014-09-25 LAB — BLOOD GAS, VENOUS
Acid-base deficit: 27.6 mmol/L — ABNORMAL HIGH (ref 0.0–2.0)
Bicarbonate: 6.2 mEq/L — ABNORMAL LOW (ref 20.0–24.0)
O2 Saturation: 70.5 %
PCO2 VEN: 31 mmHg — AB (ref 45.0–50.0)
Patient temperature: 98.6
TCO2: 6.3 mmol/L (ref 0–100)
pH, Ven: 6.932 — CL (ref 7.250–7.300)
pO2, Ven: 48.1 mmHg — ABNORMAL HIGH (ref 30.0–45.0)

## 2014-09-25 LAB — MAGNESIUM: MAGNESIUM: 2.7 mg/dL — AB (ref 1.5–2.5)

## 2014-09-25 LAB — I-STAT CG4 LACTIC ACID, ED: Lactic Acid, Venous: 6.78 mmol/L — ABNORMAL HIGH (ref 0.5–2.2)

## 2014-09-25 LAB — TROPONIN I: Troponin I: <0.3 ng/mL (ref <0.30)

## 2014-09-25 LAB — PHOSPHORUS: Phosphorus: 7.5 mg/dL — ABNORMAL HIGH (ref 2.3–4.6)

## 2014-09-25 LAB — I-STAT TROPONIN, ED: Troponin i, poc: 0 ng/mL (ref 0.00–0.08)

## 2014-09-25 MED ORDER — SODIUM CHLORIDE 0.9 % IV BOLUS (SEPSIS)
1000.0000 mL | Freq: Once | INTRAVENOUS | Status: AC
  Administered 2014-09-25: 1000 mL via INTRAVENOUS

## 2014-09-25 MED ORDER — DEXTROSE-NACL 5-0.45 % IV SOLN
INTRAVENOUS | Status: DC
Start: 2014-09-25 — End: 2014-09-26
  Administered 2014-09-26: 10:00:00 via INTRAVENOUS

## 2014-09-25 MED ORDER — ALBUTEROL SULFATE (2.5 MG/3ML) 0.083% IN NEBU
5.0000 mg | INHALATION_SOLUTION | Freq: Once | RESPIRATORY_TRACT | Status: AC
  Administered 2014-09-25: 5 mg via RESPIRATORY_TRACT
  Filled 2014-09-25: qty 6

## 2014-09-25 MED ORDER — DEXTROSE 50 % IV SOLN
25.0000 mL | INTRAVENOUS | Status: DC | PRN
Start: 2014-09-25 — End: 2014-09-27

## 2014-09-25 MED ORDER — HEPARIN SODIUM (PORCINE) 5000 UNIT/ML IJ SOLN
5000.0000 [IU] | Freq: Three times a day (TID) | INTRAMUSCULAR | Status: DC
  Administered 2014-09-25 – 2014-09-26 (×2): 5000 [IU] via SUBCUTANEOUS
  Filled 2014-09-25 (×4): qty 1

## 2014-09-25 MED ORDER — SODIUM CHLORIDE 0.9 % IV SOLN
Freq: Once | INTRAVENOUS | Status: AC
  Administered 2014-09-25: 21:00:00 via INTRAVENOUS

## 2014-09-25 MED ORDER — PIPERACILLIN-TAZOBACTAM 3.375 G IVPB 30 MIN
3.3750 g | Freq: Once | INTRAVENOUS | Status: AC
  Administered 2014-09-25: 3.375 g via INTRAVENOUS
  Filled 2014-09-25: qty 50

## 2014-09-25 MED ORDER — INSULIN ASPART 100 UNIT/ML IV SOLN
10.0000 [IU] | Freq: Once | INTRAVENOUS | Status: AC
  Administered 2014-09-25: 10 [IU] via INTRAVENOUS
  Filled 2014-09-25: qty 0.1

## 2014-09-25 MED ORDER — PANTOPRAZOLE SODIUM 40 MG IV SOLR
40.0000 mg | INTRAVENOUS | Status: DC
  Administered 2014-09-25 – 2014-09-26 (×2): 40 mg via INTRAVENOUS
  Filled 2014-09-25 (×2): qty 40

## 2014-09-25 MED ORDER — VANCOMYCIN HCL 10 G IV SOLR
1500.0000 mg | Freq: Once | INTRAVENOUS | Status: AC
  Administered 2014-09-25: 1500 mg via INTRAVENOUS
  Filled 2014-09-25: qty 1500

## 2014-09-25 MED ORDER — ONDANSETRON HCL 4 MG/2ML IJ SOLN
4.0000 mg | Freq: Once | INTRAMUSCULAR | Status: AC
  Administered 2014-09-25: 4 mg via INTRAVENOUS
  Filled 2014-09-25: qty 2

## 2014-09-25 MED ORDER — ASPIRIN 300 MG RE SUPP
300.0000 mg | Freq: Once | RECTAL | Status: AC
  Administered 2014-09-25: 300 mg via RECTAL
  Filled 2014-09-25: qty 1

## 2014-09-25 MED ORDER — ONDANSETRON HCL 4 MG/2ML IJ SOLN: 4.0000 mg | Freq: Four times a day (QID) | INTRAMUSCULAR | Status: DC | PRN

## 2014-09-25 MED ORDER — SODIUM POLYSTYRENE SULFONATE 15 GM/60ML PO SUSP
15.0000 g | Freq: Once | ORAL | Status: AC
  Administered 2014-09-25: 15 g via ORAL
  Filled 2014-09-25: qty 60

## 2014-09-25 MED ORDER — SODIUM BICARBONATE 8.4 % IV SOLN
50.0000 meq | Freq: Once | INTRAVENOUS | Status: AC
  Administered 2014-09-25: 50 meq via INTRAVENOUS

## 2014-09-25 MED ORDER — SODIUM CHLORIDE 0.9 % IV SOLN
1.0000 g | Freq: Once | INTRAVENOUS | Status: AC
  Administered 2014-09-25: 1 g via INTRAVENOUS
  Filled 2014-09-25: qty 10

## 2014-09-25 MED ORDER — SODIUM CHLORIDE 0.9 % IV SOLN
INTRAVENOUS | Status: DC
  Administered 2014-09-25: 23:00:00 via INTRAVENOUS

## 2014-09-25 MED ORDER — SODIUM CHLORIDE 0.9 % IV SOLN
INTRAVENOUS | Status: DC
  Administered 2014-09-25: 16.2 [IU]/h via INTRAVENOUS
  Filled 2014-09-25: qty 2.5

## 2014-09-25 MED ORDER — SODIUM CHLORIDE 0.9 % IV SOLN
INTRAVENOUS | Status: DC
  Administered 2014-09-25: 5.4 [IU]/h via INTRAVENOUS
  Filled 2014-09-25: qty 2.5

## 2014-09-25 NOTE — ED Notes (Signed)
Pts family remains at bedside.

## 2014-09-25 NOTE — H&P (Signed)
PULMONARY / CRITICAL CARE MEDICINE   Name: Ian Houston MRN: [id number] DOB: 07-Jan-1961    ADMISSION DATE:  09/25/2014 CONSULTATION DATE:  09/25/2014  REFERRING MD :  Hoyt Koch  CHIEF COMPLAINT: Weakness  INITIAL PRESENTATION: 53 year old male with PMH of DM 1 on insulin pump and DKA. Presented to ED 10/22 c/o weakness, vomiting, and myalgia. He was found to have profoundly elevated glucose levels as well as acidemia and hyperkalemia. PCCM asked to see for admission.   STUDIES:    SIGNIFICANT EVENTS:   HISTORY OF PRESENT ILLNESS:  53 year old male, current smoker with history of diabetes, insulin pump, diabetic ketoacidosis once presents with vomiting, bodyaches and hyperglycemia. Patient has had gradually worsening symptoms since this morning including generalized weakness and lightheaded. He has had one episode of nonbloody vomiting, no focal abdominal pain, no respiratory symptoms, no other new symptoms. When he checked his glucose 10/22 AM it was 205, felt like he had the flu. In ED he was found to have elevated glucose (>700), hyperkalemia (8.3), and profound acidemia (pH 6.9 on venous gas). He was treated for hyperkalemia in ED and PCCM has been called for admission.    PAST MEDICAL HISTORY :   has a past medical history of Diabetes mellitus without complication and Hypertension.  has past surgical history that includes Back surgery. Prior to Admission medications   Medication Sig Start Date End Date Taking? Authorizing Provider  ibuprofen (ADVIL,MOTRIN) 200 MG tablet Take 400 mg by mouth every 6 (six) hours as needed for moderate pain (neck pain).   Yes Historical Provider, MD  Insulin Human (INSULIN PUMP) SOLN Inject into the skin every hour as needed. humalog   Yes Historical Provider, MD  naproxen sodium (ANAPROX) 220 MG tablet Take 440 mg by mouth daily as needed (neck pain).   Yes Historical Provider, MD  aspirin 81 MG chewable tablet Chew 81 mg by mouth daily.     Historical Provider, MD  atorvastatin (LIPITOR) 40 MG tablet Take 40 mg by mouth daily.    Historical Provider, MD   Allergies  Allergen Reactions  . Sulfa Antibiotics     Flu like symptoms    FAMILY HISTORY:  has no family status information on file.  SOCIAL HISTORY:  reports that he has been smoking.  His smokeless tobacco use includes Chew. He reports that he drinks alcohol.  REVIEW OF SYSTEMS:  Limited by lethargy Bolds are positive  Constitutional: weight loss, gain, night sweats, Fevers, chills, fatigue .  HEENT: headaches, Sore throat, sneezing, nasal congestion, post nasal drip, Difficulty swallowing, Tooth/dental problems, visual complaints visual changes, ear ache CV: Chest pain, epigastric. Orthopnea, PND, swelling in lower extremities, dizziness, palpitations, syncope.  GI  heartburn, indigestion, abdominal pain, nausea, vomiting, diarrhea, change in bowel habits, loss of appetite, bloody stools.  Resp: cough, productive: , hemoptysis, dyspnea, chest pain, pleuritic.  Skin: rash or itching or icterus GU: dysuria, change in color of urine, urgency or frequency. flank pain, hematuria  MS: joint pain or swelling. decreased range of motion  Psych: change in mood or affect. depression or anxiety.  Neuro: difficulty with speech, weakness, numbness, ataxia    SUBJECTIVE:   VITAL SIGNS: Temp:  [97.5 F (36.4 C)] 97.5 F (36.4 C) (10/22 1930) Pulse Rate:  [58-118] 118 (10/22 1930) Resp:  [18-19] 19 (10/22 1930) BP: (88-96)/(38-42) 96/42 mmHg (10/22 1930) SpO2:  [100 %] 100 % (10/22 1930) HEMODYNAMICS:   VENTILATOR SETTINGS:   INTAKE / OUTPUT:  No intake or output data in the 24 hours ending 09/25/14 2030  PHYSICAL EXAMINATION: General:  Overweight (217 lbs) male, appears drowsey Neuro:  Lethargic, alert, oriented. Follows commands.  HEENT:  [place]/AT, PERRL, No JVD noted Cardiovascular:  Tachy, regular, no MRG noted Lungs:  Even, mildly labored deep respirations,  regular. Clear breath sounds.  Abdomen:  Large, non-tender, non-distended, RLQ and LLQ discoloration reportedly from insulin pump. Musculoskeletal:  No acute deformity or ROM limitation Skin:  Intact  LABS:  CBC  Recent Labs Lab 09/25/14 2013  HGB 16.7  HCT 49.0   Coag's No results found for this basename: APTT, INR,  in the last 168 hours BMET  Recent Labs Lab 09/25/14 2013  NA 125*  K 8.3*  CL 106  BUN 53*  CREATININE 1.60*  GLUCOSE >700*   Electrolytes No results found for this basename: CALCIUM, MG, PHOS,  in the last 168 hours Sepsis Markers  Recent Labs Lab 09/25/14 1932  LATICACIDVEN 6.78*   ABG No results found for this basename: PHART, PCO2ART, PO2ART,  in the last 168 hours Liver Enzymes No results found for this basename: AST, ALT, ALKPHOS, BILITOT, ALBUMIN,  in the last 168 hours Cardiac Enzymes No results found for this basename: TROPONINI, PROBNP,  in the last 168 hours Glucose  Recent Labs Lab 09/25/14 1837  GLUCAP >600*    Imaging No results found.   ASSESSMENT / PLAN:  PULMONARY A: Tobacco abuse disorder  P:   Supplemental O2 as needed to maintain SpO2 greater than 92% Smiking cessation counseling  CARDIOVASCULAR A:  Tachycardia - sinus Epigastric pain, no obvious ischemic changes on ECG  P:  Troponin pending, trend Cardiology to see Holding statin for now  RENAL A:   High AG metabolic acidosis - lactic, ketoacidosis AKI Hyperkalemia - treated in ED with insulin, calcium Dehydration Pseudohyponatremia  P:   NS bolus 2 Liters Then run NS at 17800mL/hr Transition to D5 per DKA protocol Serial BMPs Defer further K correction to DKA protocol F/u UA  GASTROINTESTINAL A:   Nausea, vomiting  P:   NPO SUP: IV protonix PRN Zofran  HEMATOLOGIC A:   Leukocytosis  P:  Follow CBC  INFECTIOUS A:   SIRS, no obvious source  P:   BCx2 10/22 >>> UC 10/22 >>> Monitor off ABX for now with low threshold to  initiate in setting of decline or fever.   ENDOCRINE A:   Diabetic Ketoacidosis H/o DM1 on insulin pump   P:   Insulin gtt per DKA/glucostabilizer protocol CBG monitoring Will need to be seen by diabetes coordinator/endocrinology in follow up - Insulin pump likely malfunctioning  NEUROLOGIC A:  Acute metabolic encephalopathy in setting of profound acidemia.   P:   RASS goal: 0 Monitor   FAMILY  - Updates: Wife and brother 10/22.    Joneen RoachPaul Hoffman, ACNP [facility] Pager [phone] or [phone]   TODAY'S SUMMARY: 53 year old male with PMH of DM and hyperlipidemia presents 10/22 with DKA. Will monitor in ICU overnight with IV insulin and IVF resuscitation.   Reviewed above, examined.  53 yo with type I DM developed N/V/D and epigastric discomfort today.  Family reports he was in usual state of health until this AM.  Presented to ED and found to have DKA with accompanying hyperkalemia with ECG changes >> improved after starting insulin/IV fluid and calcium.  He has elevation in WBC, but no obvious source of bacterial infection.  Cardiology consulted by EDP >> ?  If his symptoms were more GI related rather than chest pain.  He does not have any evidence for sinus infection, oral exudate.  Chest and abdominal exams are benign, CXR is clear, and no evidence for foot infections.  U/A is pending.  He did receive dose of Abx in ED >> will hold off on further Abx for now since no obvious source of bacterial infection.  Will need to continue IV insulin until anion gap closed, and then transition to SQ insulin.  Will continue normal saline IV fluid until blood sugar < 250, and then transition to D5 1/2 NS.  Defer further tx for hyperkalemia since ECG has improved, and K will decrease with correction of acidosis.  Will f/u electrolytes closely and start to replace as needed.    Question going forward is to determine why he has recurrent DKA over the past several  weeks, and whether there is an issue with his insulin pump delivery.  Updated pt's wife at bedside about plan.  CC time by me, independent of NP time, is 35 minutes.  Coralyn HellingVineet Jamear Carbonneau, MD [facility] 09/25/2014, 9:52 PM Pager:  [phone] After 3pm call: [phone]

## 2014-09-25 NOTE — ED Notes (Signed)
EKG given to EDP, Zavitz,MD., for review.

## 2014-09-25 NOTE — ED Provider Notes (Signed)
CSN: [id number]     Arrival date & time 09/25/14  1830 History   First MD Initiated Contact with Patient 09/25/14 1851     Chief Complaint  Patient presents with  . Hyperglycemia     (Consider location/radiation/quality/duration/timing/severity/associated sxs/prior Treatment) HPI Comments: 53 year old male with history of diabetes, insulin pump, diabetic ketoacidosis once presents with vomiting, bodyaches and hyperglycemia. Patient has had gradually worsening symptoms since this morning. Patient feels generally weak and lightheaded. Patient has had one episode of nonbloody vomiting, no focal abdominal pain, no respiratory symptoms, no other new symptoms. Patient felt like he had the flu. Patient has been admitted for DKA in the past. Patient had a concern for the function of his insulin pump last admission however feels it has been working recently. Patient is a smoker.  The history is provided by the patient.    Past Medical History  Diagnosis Date  . Diabetes mellitus without complication   . Hypertension    Past Surgical History  Procedure Laterality Date  . Back surgery     No family history on file. History  Substance Use Topics  . Smoking status: Current Every Day Smoker  . Smokeless tobacco: Current User    Types: Chew  . Alcohol Use: Yes     Comment: daily    Review of Systems  Constitutional: Positive for fatigue. Negative for fever and chills.  HENT: Negative for congestion.   Eyes: Negative for visual disturbance.  Respiratory: Negative for shortness of breath.   Cardiovascular: Positive for chest pain (anterior ache nonradiating).  Gastrointestinal: Negative for vomiting and abdominal pain.  Genitourinary: Negative for dysuria and flank pain.  Musculoskeletal: Positive for arthralgias and myalgias. Negative for back pain, neck pain and neck stiffness.  Skin: Negative for rash.  Neurological: Positive for light-headedness. Negative for headaches.       Allergies  Sulfa antibiotics  Home Medications   Prior to Admission medications   Medication Sig Start Date End Date Taking? Authorizing Provider  aspirin 81 MG chewable tablet Chew 81 mg by mouth daily.    Historical Provider, MD  atorvastatin (LIPITOR) 40 MG tablet Take 40 mg by mouth daily.    Historical Provider, MD  insulin detemir (LEVEMIR) 100 UNIT/ML injection Inject 30 units into the skin once daily if the insulin pump fails 08/16/14   Jarome Matin, MD  Insulin Human (INSULIN PUMP) SOLN Inject into the skin every hour as needed. humalog    Historical Provider, MD   BP 96/42  Pulse 118  Temp(Src) 97.5 F (36.4 C) (Oral)  Resp 19  SpO2 100% Physical Exam  Nursing note and vitals reviewed. Constitutional: He is oriented to person, place, and time. He appears well-developed and well-nourished.  HENT:  Head: Normocephalic and atraumatic.  Dry mucous membranes  Eyes: Conjunctivae are normal. Right eye exhibits no discharge. Left eye exhibits no discharge.  Neck: Normal range of motion. Neck supple. No tracheal deviation present.  Cardiovascular: Regular rhythm.  Tachycardia present.   Pulmonary/Chest: Breath sounds normal. Respiratory distress: tachypnea.  Abdominal: Soft. He exhibits no distension. There is tenderness (mild epigastric). There is no guarding.  Musculoskeletal: He exhibits no edema.  Neurological: He is alert and oriented to person, place, and time. GCS eye subscore is 4. GCS verbal subscore is 5. GCS motor subscore is 6.  Mild lethargy Patient easily awakens to verbal stimulation and answers questions appropriately, very fatigued appearance. Patient moves all extremity is equal bilateral with mild weakness, neck supple no  meningismus.  Skin: Skin is warm. No rash noted.  Psychiatric:  Mild lethargy    ED Course  Procedures (including critical care time) CRITICAL CARE Performed by: Enid Skeens   Total critical care time: 60  min  Critical care time was exclusive of separately billable procedures and treating other patients.  Critical care was necessary to treat or prevent imminent or life-threatening deterioration.  Critical care was time spent personally by me on the following activities: development of treatment plan with patient and/or surrogate as well as nursing, discussions with consultants, evaluation of patient's response to treatment, examination of patient, obtaining history from patient or surrogate, ordering and performing treatments and interventions, ordering and review of laboratory studies, ordering and review of radiographic studies, pulse oximetry and re-evaluation of patient's condition.  Labs Review Labs Reviewed  CBC WITH DIFFERENTIAL - Abnormal; Notable for the following:    WBC 35.6 (*)    Platelets 436 (*)    Neutrophils Relative % 86 (*)    Neutro Abs 30.5 (*)    Lymphocytes Relative 6 (*)    Monocytes Absolute 2.8 (*)    All other components within normal limits  BLOOD GAS, VENOUS - Abnormal; Notable for the following:    pH, Ven 6.932 (*)    pCO2, Ven 31.0 (*)    pO2, Ven 48.1 (*)    Bicarbonate 6.2 (*)    Acid-base deficit 27.6 (*)    All other components within normal limits  COMPREHENSIVE METABOLIC PANEL - Abnormal; Notable for the following:    Sodium 132 (*)    Potassium >7.7 (*)    Chloride 92 (*)    CO2 <7 (*)    Glucose, Bld 735 (*)    BUN 36 (*)    Creatinine, Ser 1.43 (*)    AST 41 (*)    ALT 64 (*)    Alkaline Phosphatase 123 (*)    GFR calc non Af Amer 55 (*)    GFR calc Af Amer 63 (*)    All other components within normal limits  MAGNESIUM - Abnormal; Notable for the following:    Magnesium 2.7 (*)    All other components within normal limits  PHOSPHORUS - Abnormal; Notable for the following:    Phosphorus 7.5 (*)    All other components within normal limits  BASIC METABOLIC PANEL - Abnormal; Notable for the following:    Sodium 132 (*)    Potassium  6.2 (*)    Chloride 94 (*)    CO2 7 (*)    Glucose, Bld 646 (*)    BUN 36 (*)    Creatinine, Ser 1.51 (*)    GFR calc non Af Amer 51 (*)    GFR calc Af Amer 59 (*)    Anion gap 31 (*)    All other components within normal limits  GLUCOSE, CAPILLARY - Abnormal; Notable for the following:    Glucose-Capillary >600 (*)    All other components within normal limits  GLUCOSE, CAPILLARY - Abnormal; Notable for the following:    Glucose-Capillary 597 (*)    All other components within normal limits  CBG MONITORING, ED - Abnormal; Notable for the following:    Glucose-Capillary >600 (*)    All other components within normal limits  I-STAT CG4 LACTIC ACID, ED - Abnormal; Notable for the following:    Lactic Acid, Venous 6.78 (*)    All other components within normal limits  I-STAT CHEM 8, ED - Abnormal;  Notable for the following:    Sodium 125 (*)    Potassium 8.3 (*)    BUN 53 (*)    Creatinine, Ser 1.60 (*)    Glucose, Bld >700 (*)    Calcium, Ion 1.04 (*)    All other components within normal limits  CBG MONITORING, ED - Abnormal; Notable for the following:    Glucose-Capillary >600 (*)    All other components within normal limits  CBG MONITORING, ED - Abnormal; Notable for the following:    Glucose-Capillary >600 (*)    All other components within normal limits  CULTURE, BLOOD (ROUTINE X 2)  CULTURE, BLOOD (ROUTINE X 2)  URINE CULTURE  MRSA PCR SCREENING  TROPONIN I  URINALYSIS, ROUTINE W REFLEX MICROSCOPIC  BASIC METABOLIC PANEL  BASIC METABOLIC PANEL  BASIC METABOLIC PANEL  CBC  MAGNESIUM  PHOSPHORUS  BASIC METABOLIC PANEL  I-STAT TROPOININ, ED  CBG MONITORING, ED  CBG MONITORING, ED  CBG MONITORING, ED  CBG MONITORING, ED  CBG MONITORING, ED  CBG MONITORING, ED  CBG MONITORING, ED  CBG MONITORING, ED  CBG MONITORING, ED  CBG MONITORING, ED  CBG MONITORING, ED  CBG MONITORING, ED  CBG MONITORING, ED  CBG MONITORING, ED  CBG MONITORING, ED  CBG MONITORING,  ED  CBG MONITORING, ED  CBG MONITORING, ED  CBG MONITORING, ED  CBG MONITORING, ED  CBG MONITORING, ED  CBG MONITORING, ED  CBG MONITORING, ED  CBG MONITORING, ED  CBG MONITORING, ED    Imaging Review Dg Chest Port 1 View  09/25/2014   CLINICAL DATA:  Chest pain today.  Smoker.  EXAM: PORTABLE CHEST - 1 VIEW  COMPARISON:  08/15/2014.  FINDINGS: Normal sized heart. Clear lungs. Diffuse peribronchial thickening and accentuation of the interstitial markings. Cervical spine fixation hardware.  IMPRESSION: No acute abnormality.  Chronic bronchitic changes.   Electronically Signed   By: Gordan PaymentSteve  Reid M.D.   On: 09/25/2014 20:49     EKG Interpretation   Date/Time:  Thursday September 25 2014 19:13:22 EDT Ventricular Rate:  115 PR Interval:  114 QRS Duration: 133 QT Interval:  347 QTC Calculation: 480 R Axis:   84 Text Interpretation:  Sinus tachycardia IVCD, consider atypical RBBB  Probable lateral infarct, age indeterminate Abnrm T, consider ischemia,  anterolateral lds Confirmed by Jodi MourningZAVITZ  MD, Anndrea Mihelich ([id number]) on 09/25/2014  8:11:40 PM      MDM   Final diagnoses:  Chest pain  Abnormal EKG  Hyperkalemia  Type 1 diabetes mellitus with ketoacidosis and without coma  Non-intractable vomiting with nausea, vomiting of unspecified type  Acute renal failure, unspecified acute renal failure type  Sepsis, due to unspecified organism  Lactic acidosis  Sepsis  Patient presents with clinically diabetic ketoacidosis, symptoms consistent with viral process with bodyaches/vomiting however with chest discomfort EKG ordered. EKG shows mild widening QRS, diffuse ST depression, mild lateral elevation. This may be from cardiac ischemia versus hyperkalemia. Hyperkalemia protocol ordered.. Blood pressure hypotensive on arrival tachycardic. 2 L fluid bolus ordered. Lactic acid significantly elevated. Antibiotics ordered.  PH 6.9. Blood work pending, insulin drip ordered, potassium pending. Discussed  with critical care. Potassium returned significantly elevated and abnormal EKG. Repeat critical care to discuss update and page cardiology to discuss. Plan for hyperkalemia treatment and repeat EKG and reassessment.  Patient had mild improvement in the emergency department on rechecks, blood pressure improved from the 80s to 100 systolic. Discussed the case with cardiology with abnormal EKG and atypical chest pain, with  hyperkalemia and no current chest pain plan to follow troponins. Discussed the case with critical care for admission. With initial hypotension, lactic acidosis, significant white blood cell count elevation and no obvious etiology plan for broad antibiotics to cover for sepsis.  The patients results and plan were reviewed and discussed.   Any x-rays performed were personally reviewed by myself.   Differential diagnosis were considered with the presenting HPI.  Medications  dextrose 5 %-0.45 % sodium chloride infusion (not administered)  insulin regular (NOVOLIN R,HUMULIN R) 250 Units in sodium chloride 0.9 % 250 mL (1 Units/mL) infusion (not administered)  dextrose 50 % solution 25 mL (not administered)  heparin injection 5,000 Units (not administered)  0.9 %  sodium chloride infusion (not administered)  pantoprazole (PROTONIX) injection 40 mg (not administered)  ondansetron (ZOFRAN) injection 4 mg (not administered)  vancomycin (VANCOCIN) 15 mg/kg in sodium chloride 0.9 % 100 mL IVPB (not administered)  sodium chloride 0.9 % bolus 1,000 mL (0 mLs Intravenous Stopped 09/25/14 2033)  ondansetron (ZOFRAN) injection 4 mg (4 mg Intravenous Given 09/25/14 1927)  sodium chloride 0.9 % bolus 1,000 mL (0 mLs Intravenous Stopped 09/25/14 2033)  piperacillin-tazobactam (ZOSYN) IVPB 3.375 g (0 g Intravenous Stopped 09/25/14 2106)  0.9 %  sodium chloride infusion ( Intravenous New Bag/Given 09/25/14 2033)  aspirin suppository 300 mg (300 mg Rectal Given 09/25/14 2051)  calcium gluconate 1  g in sodium chloride 0.9 % 100 mL IVPB (1 g Intravenous New Bag/Given 09/25/14 2108)  albuterol (PROVENTIL) (2.5 MG/3ML) 0.083% nebulizer solution 5 mg (5 mg Nebulization Given 09/25/14 2055)  insulin aspart (novoLOG) injection 10 Units (10 Units Intravenous Given 09/25/14 2103)    Filed Vitals:   09/25/14 1842 09/25/14 1930 09/25/14 1930 09/25/14 1945  BP: 90/38 96/42 96/42  103/44  Pulse:  118 118 121  Temp:   97.5 F (36.4 C)   TempSrc:   Oral   Resp:  19 19 23   SpO2:  100% 100% 100%    Final diagnoses:  Chest pain  Abnormal EKG  Hyperkalemia  Type 1 diabetes mellitus with ketoacidosis and without coma  Non-intractable vomiting with nausea, vomiting of unspecified type  Acute renal failure, unspecified acute renal failure type  Sepsis, due to unspecified organism  Lactic acidosis   acute metabolic acidosis/lactic acidosis  Admission/ observation were discussed with the admitting physician, patient and/or family and they are comfortable with the plan.           Enid SkeensJoshua M Frannie Shedrick, MD 09/26/14 [phone]

## 2014-09-25 NOTE — ED Notes (Signed)
EDP made aware of patient I-Stat Chem 8 results.

## 2014-09-25 NOTE — ED Notes (Signed)
EDP made aware of patient C G4 Lactic results.

## 2014-09-25 NOTE — ED Notes (Signed)
Bed: WA14 Expected date:  Expected time:  Means of arrival:  Comments: Triage 2  

## 2014-09-25 NOTE — ED Notes (Signed)
Pt states that he felt like he had the flu this morning when he woke up (body aches and fatigue).  Has insulin pump on.  States he checked his sugar before he came and it was >600.  States that he has had NVD today.  Pt is lethargic.  CBG here over 600.  20 units of humalog before he came.  Insulin pump running as normal.

## 2014-09-25 NOTE — ED Notes (Signed)
MD at bedside.  Critical MD

## 2014-09-25 NOTE — ED Notes (Signed)
MD at bedside.  Critical care NP/PA at bedside.

## 2014-09-26 ENCOUNTER — Encounter (HOSPITAL_COMMUNITY): Payer: Self-pay

## 2014-09-26 DIAGNOSIS — R9431 Abnormal electrocardiogram [ECG] [EKG]: Secondary | ICD-10-CM

## 2014-09-26 DIAGNOSIS — I059 Rheumatic mitral valve disease, unspecified: Secondary | ICD-10-CM

## 2014-09-26 DIAGNOSIS — E872 Acidosis: Secondary | ICD-10-CM

## 2014-09-26 DIAGNOSIS — I248 Other forms of acute ischemic heart disease: Secondary | ICD-10-CM

## 2014-09-26 LAB — PROCALCITONIN: PROCALCITONIN: 6.47 ng/mL

## 2014-09-26 LAB — BASIC METABOLIC PANEL
Anion gap: 24 — ABNORMAL HIGH (ref 5–15)
Anion gap: 14 (ref 5–15)
Anion gap: 11 (ref 5–15)
Anion gap: 11 (ref 5–15)
Anion gap: 13 (ref 5–15)
BUN: 33 mg/dL — ABNORMAL HIGH (ref 6–23)
BUN: 27 mg/dL — ABNORMAL HIGH (ref 6–23)
BUN: 24 mg/dL — ABNORMAL HIGH (ref 6–23)
BUN: 21 mg/dL (ref 6–23)
BUN: 19 mg/dL (ref 6–23)
CALCIUM: 8.4 mg/dL (ref 8.4–10.5)
CALCIUM: 8.2 mg/dL — AB (ref 8.4–10.5)
CHLORIDE: 100 meq/L (ref 96–112)
CHLORIDE: 104 meq/L (ref 96–112)
CO2: 10 mEq/L — CL (ref 19–32)
CO2: 17 mEq/L — ABNORMAL LOW (ref 19–32)
CO2: 19 meq/L (ref 19–32)
CO2: 19 meq/L (ref 19–32)
CO2: 19 meq/L (ref 19–32)
CREATININE: 1.08 mg/dL (ref 0.50–1.35)
Calcium: 8.7 mg/dL (ref 8.4–10.5)
Calcium: 8.4 mg/dL (ref 8.4–10.5)
Calcium: 8.3 mg/dL — ABNORMAL LOW (ref 8.4–10.5)
Chloride: 108 mEq/L (ref 96–112)
Chloride: 106 mEq/L (ref 96–112)
Chloride: 104 mEq/L (ref 96–112)
Creatinine, Ser: 1.44 mg/dL — ABNORMAL HIGH (ref 0.50–1.35)
Creatinine, Ser: 1.24 mg/dL (ref 0.50–1.35)
Creatinine, Ser: 1.06 mg/dL (ref 0.50–1.35)
Creatinine, Ser: 1.14 mg/dL (ref 0.50–1.35)
GFR calc Af Amer: 63 mL/min — ABNORMAL LOW (ref >90)
GFR calc Af Amer: 89 mL/min — ABNORMAL LOW (ref >90)
GFR calc Af Amer: >90 mL/min (ref >90)
GFR calc Af Amer: 83 mL/min — ABNORMAL LOW (ref >90)
GFR calc non Af Amer: 65 mL/min — ABNORMAL LOW (ref >90)
GFR calc non Af Amer: 78 mL/min — ABNORMAL LOW (ref >90)
GFR calc non Af Amer: 72 mL/min — ABNORMAL LOW (ref >90)
GFR, EST AFRICAN AMERICAN: 75 mL/min — AB (ref >90)
GFR, EST NON AFRICAN AMERICAN: 54 mL/min — AB (ref >90)
GFR, EST NON AFRICAN AMERICAN: 77 mL/min — AB (ref >90)
GLUCOSE: 129 mg/dL — AB (ref 70–99)
GLUCOSE: 159 mg/dL — AB (ref 70–99)
Glucose, Bld: 448 mg/dL — ABNORMAL HIGH (ref 70–99)
Glucose, Bld: 255 mg/dL — ABNORMAL HIGH (ref 70–99)
Glucose, Bld: 145 mg/dL — ABNORMAL HIGH (ref 70–99)
Potassium: 5.5 mEq/L — ABNORMAL HIGH (ref 3.7–5.3)
Potassium: 4.6 mEq/L (ref 3.7–5.3)
Potassium: 4.1 mEq/L (ref 3.7–5.3)
Potassium: 4.1 mEq/L (ref 3.7–5.3)
Potassium: 4.2 mEq/L (ref 3.7–5.3)
SODIUM: 138 meq/L (ref 137–147)
SODIUM: 136 meq/L — AB (ref 137–147)
SODIUM: 136 meq/L — AB (ref 137–147)
Sodium: 134 mEq/L — ABNORMAL LOW (ref 137–147)
Sodium: 135 mEq/L — ABNORMAL LOW (ref 137–147)

## 2014-09-26 LAB — CBC
HEMATOCRIT: 44.5 % (ref 39.0–52.0)
HEMOGLOBIN: 15 g/dL (ref 13.0–17.0)
MCH: 29.8 pg (ref 26.0–34.0)
MCHC: 33.7 g/dL (ref 30.0–36.0)
MCV: 88.5 fL (ref 78.0–100.0)
Platelets: 323 10*3/uL (ref 150–400)
RBC: 5.03 MIL/uL (ref 4.22–5.81)
RDW: 12.3 % (ref 11.5–15.5)
WBC: 27.7 10*3/uL — ABNORMAL HIGH (ref 4.0–10.5)

## 2014-09-26 LAB — GLUCOSE, CAPILLARY
GLUCOSE-CAPILLARY: 439 mg/dL — AB (ref 70–99)
GLUCOSE-CAPILLARY: 210 mg/dL — AB (ref 70–99)
GLUCOSE-CAPILLARY: 171 mg/dL — AB (ref 70–99)
GLUCOSE-CAPILLARY: 154 mg/dL — AB (ref 70–99)
GLUCOSE-CAPILLARY: 120 mg/dL — AB (ref 70–99)
GLUCOSE-CAPILLARY: 126 mg/dL — AB (ref 70–99)
GLUCOSE-CAPILLARY: 124 mg/dL — AB (ref 70–99)
GLUCOSE-CAPILLARY: 121 mg/dL — AB (ref 70–99)
GLUCOSE-CAPILLARY: 130 mg/dL — AB (ref 70–99)
Glucose-Capillary: 522 mg/dL — ABNORMAL HIGH (ref 70–99)
Glucose-Capillary: 464 mg/dL — ABNORMAL HIGH (ref 70–99)
Glucose-Capillary: 445 mg/dL — ABNORMAL HIGH (ref 70–99)
Glucose-Capillary: 359 mg/dL — ABNORMAL HIGH (ref 70–99)
Glucose-Capillary: 328 mg/dL — ABNORMAL HIGH (ref 70–99)
Glucose-Capillary: 305 mg/dL — ABNORMAL HIGH (ref 70–99)
Glucose-Capillary: 253 mg/dL — ABNORMAL HIGH (ref 70–99)
Glucose-Capillary: 258 mg/dL — ABNORMAL HIGH (ref 70–99)
Glucose-Capillary: 244 mg/dL — ABNORMAL HIGH (ref 70–99)
Glucose-Capillary: 123 mg/dL — ABNORMAL HIGH (ref 70–99)
Glucose-Capillary: 165 mg/dL — ABNORMAL HIGH (ref 70–99)

## 2014-09-26 LAB — MAGNESIUM: MAGNESIUM: 2.4 mg/dL (ref 1.5–2.5)

## 2014-09-26 LAB — HEPARIN LEVEL (UNFRACTIONATED): Heparin Unfractionated: <0.1 IU/mL — ABNORMAL LOW (ref 0.30–0.70)

## 2014-09-26 LAB — LACTIC ACID, PLASMA: Lactic Acid, Venous: 1.1 mmol/L (ref 0.5–2.2)

## 2014-09-26 LAB — PROTIME-INR
INR: 0.96 (ref 0.00–1.49)
Prothrombin Time: 12.9 seconds (ref 11.6–15.2)

## 2014-09-26 LAB — URINALYSIS, ROUTINE W REFLEX MICROSCOPIC
Bilirubin Urine: NEGATIVE
Ketones, ur: >80 mg/dL — AB
Leukocytes, UA: NEGATIVE
Nitrite: NEGATIVE
Protein, ur: 30 mg/dL — AB
Specific Gravity, Urine: 1.023 (ref 1.005–1.030)
UROBILINOGEN UA: 0.2 mg/dL (ref 0.0–1.0)
pH: 5 (ref 5.0–8.0)

## 2014-09-26 LAB — URINE MICROSCOPIC-ADD ON

## 2014-09-26 LAB — TROPONIN I: TROPONIN I: 7.53 ng/mL — AB (ref <0.30)

## 2014-09-26 LAB — MRSA PCR SCREENING: MRSA by PCR: NEGATIVE

## 2014-09-26 LAB — APTT: aPTT: 26 seconds (ref 24–37)

## 2014-09-26 LAB — PHOSPHORUS: Phosphorus: 3 mg/dL (ref 2.3–4.6)

## 2014-09-26 MED ORDER — INSULIN ASPART 100 UNIT/ML ~~LOC~~ SOLN
3.0000 [IU] | Freq: Three times a day (TID) | SUBCUTANEOUS | Status: DC
  Administered 2014-09-26 – 2014-09-28 (×7): 3 [IU] via SUBCUTANEOUS

## 2014-09-26 MED ORDER — HEPARIN (PORCINE) IN NACL 100-0.45 UNIT/ML-% IJ SOLN
1600.0000 [IU]/h | INTRAMUSCULAR | Status: DC
  Filled 2014-09-26: qty 250

## 2014-09-26 MED ORDER — HEPARIN (PORCINE) IN NACL 100-0.45 UNIT/ML-% IJ SOLN
1150.0000 [IU]/h | INTRAMUSCULAR | Status: DC
  Administered 2014-09-26: 1150 [IU]/h via INTRAVENOUS
  Filled 2014-09-26: qty 250

## 2014-09-26 MED ORDER — HEPARIN BOLUS VIA INFUSION
3000.0000 [IU] | INTRAVENOUS | Status: AC
  Administered 2014-09-26: 3000 [IU] via INTRAVENOUS
  Filled 2014-09-26: qty 3000

## 2014-09-26 MED ORDER — MENTHOL 3 MG MT LOZG
1.0000 | LOZENGE | OROMUCOSAL | Status: DC | PRN
  Administered 2014-09-26: 3 mg via ORAL
  Filled 2014-09-26: qty 9

## 2014-09-26 MED ORDER — ACETAMINOPHEN 325 MG PO TABS
650.0000 mg | ORAL_TABLET | Freq: Four times a day (QID) | ORAL | Status: DC | PRN
  Administered 2014-09-26: 650 mg via ORAL
  Filled 2014-09-26: qty 2

## 2014-09-26 MED ORDER — NICOTINE 21 MG/24HR TD PT24
21.0000 mg | MEDICATED_PATCH | Freq: Every day | TRANSDERMAL | Status: DC
  Administered 2014-09-26 – 2014-09-30 (×4): 21 mg via TRANSDERMAL
  Filled 2014-09-26 (×5): qty 1

## 2014-09-26 MED ORDER — INFLUENZA VAC SPLIT QUAD 0.5 ML IM SUSY
0.5000 mL | PREFILLED_SYRINGE | INTRAMUSCULAR | Status: AC
  Administered 2014-09-27: 0.5 mL via INTRAMUSCULAR
  Filled 2014-09-26 (×3): qty 0.5

## 2014-09-26 MED ORDER — HEPARIN BOLUS VIA INFUSION
3000.0000 [IU] | Freq: Once | INTRAVENOUS | Status: AC
Start: 2014-09-26 — End: 2014-09-26
  Administered 2014-09-26: 3000 [IU] via INTRAVENOUS
  Filled 2014-09-26: qty 3000

## 2014-09-26 MED ORDER — INSULIN DETEMIR 100 UNIT/ML ~~LOC~~ SOLN
46.0000 [IU] | Freq: Every day | SUBCUTANEOUS | Status: DC
  Administered 2014-09-26 – 2014-09-28 (×3): 46 [IU] via SUBCUTANEOUS
  Filled 2014-09-26 (×5): qty 0.46

## 2014-09-26 MED ORDER — ASPIRIN 81 MG PO CHEW
81.0000 mg | CHEWABLE_TABLET | Freq: Every day | ORAL | Status: DC
  Administered 2014-09-26 – 2014-09-30 (×4): 81 mg via ORAL
  Filled 2014-09-26 (×5): qty 1

## 2014-09-26 MED ORDER — INSULIN ASPART 100 UNIT/ML ~~LOC~~ SOLN
0.0000 [IU] | Freq: Three times a day (TID) | SUBCUTANEOUS | Status: DC
  Administered 2014-09-26: 1 [IU] via SUBCUTANEOUS
  Administered 2014-09-27: 2 [IU] via SUBCUTANEOUS
  Administered 2014-09-27: 7 [IU] via SUBCUTANEOUS
  Administered 2014-09-27: 3 [IU] via SUBCUTANEOUS
  Administered 2014-09-28: 5 [IU] via SUBCUTANEOUS
  Administered 2014-09-28: 2 [IU] via SUBCUTANEOUS
  Administered 2014-09-28: 5 [IU] via SUBCUTANEOUS
  Administered 2014-09-29: 3 [IU] via SUBCUTANEOUS

## 2014-09-26 MED ORDER — INSULIN ASPART 100 UNIT/ML ~~LOC~~ SOLN
0.0000 [IU] | Freq: Every day | SUBCUTANEOUS | Status: DC
Start: 2014-09-26 — End: 2014-09-29

## 2014-09-26 NOTE — Progress Notes (Addendum)
CRITICAL VALUE ALERT  Critical value received:  Troponin 7.53  Date of notification:  09/26/14  Time of notification:  1140  Critical value read back:Yes.    Nurse who received alert:  Santiago GladAllison Harrell Niehoff, RN  MD notified (1st page):  Dr. Marchelle Gearingamaswamy  Time of first page:  1140 Face to face  Responding MD:  Dr. Marchelle Gearingamaswamy  Time MD responded:  1140 Face to face

## 2014-09-26 NOTE — Progress Notes (Signed)
PULMONARY / CRITICAL CARE MEDICINE   Name: Ian Houston MRN: [id number] DOB: 1961-09-26    ADMISSION DATE:  09/25/2014 CONSULTATION DATE:  09/25/2014  REFERRING MD :  Patience Musca  CHIEF COMPLAINT: Weakness  INITIAL PRESENTATION: 53 year old male with PMH of DM 1 on insulin pump and DKA. Presented to ED 10/22 c/o weakness, vomiting, and myalgia. He was found to have profoundly elevated glucose levels as well as acidemia and hyperkalemia. PCCM asked to see for admission.   STUDIES:    SIGNIFICANT EVENTS: 10/22 admitted with DKA ?insulin pump failure.  HISTORY OF PRESENT ILLNESS:  53 year old male, current smoker with history of diabetes, insulin pump, diabetic ketoacidosis once presents with vomiting, bodyaches and hyperglycemia. Patient has had gradually worsening symptoms since this morning including generalized weakness and lightheaded. He has had one episode of nonbloody vomiting, no focal abdominal pain, no respiratory symptoms, no other new symptoms. When he checked his glucose 10/22 AM it was 205, felt like he had the flu. In ED he was found to have elevated glucose (>700), hyperkalemia (8.3), and profound acidemia (pH 6.9 on venous gas). He was treated for hyperkalemia in ED and PCCM has been called for admission.    SUBJECTIVE:  NAD VITAL SIGNS: Temp:  [97.5 F (36.4 C)-99.2 F (37.3 C)] 98.6 F (37 C) (10/23 0800) Pulse Rate:  [58-124] 98 (10/23 0800) Resp:  [15-23] 15 (10/23 0800) BP: (86-120)/(38-71) 99/71 mmHg (10/23 0800) SpO2:  [94 %-100 %] 97 % (10/23 0800) Weight:  [220 lb (99.791 kg)-220 lb 3.8 oz (99.9 kg)] 220 lb 3.8 oz (99.9 kg) (10/22 2200) HEMODYNAMICS:   VENTILATOR SETTINGS:   INTAKE / OUTPUT:  Intake/Output Summary (Last 24 hours) at 09/26/14 0354 Last data filed at 09/26/14 0800  Gross per 24 hour  Intake 5244.7 ml  Output   2300 ml  Net 2944.7 ml    PHYSICAL EXAMINATION: General:  Overweight (217 lbs) male, awake and alert Neuro:  , alert,  oriented. Follows commands.  HEENT:  [place]/AT, PERRL, No JVD noted Cardiovascular:  Tachy, regular, no MRG noted Lungs: Clear breath sounds.  Abdomen:   non-tender, non-distended, RLQ and LLQ discoloration reportedly from insulin pump. Musculoskeletal:  No acute deformity or ROM limitation Skin:  Intact, warm  LABS:  PULMONARY  Recent Labs Lab 09/25/14 1925 09/25/14 2013  HCO3 6.2*  --   TCO2 6.3 7  O2SAT 70.5  --     CBC  Recent Labs Lab 09/25/14 1919 09/25/14 2013 09/26/14 0338  HGB 15.8 16.7 15.0  HCT 50.0 49.0 44.5  WBC 35.6*  --  27.7*  PLT 436*  --  323    COAGULATION No results found for this basename: INR,  in the last 168 hours  CARDIAC   Recent Labs Lab 09/25/14 2012  TROPONINI <0.30   No results found for this basename: PROBNP,  in the last 168 hours   CHEMISTRY  Recent Labs Lab 09/25/14 2013 09/25/14 2120 09/25/14 2127 09/25/14 2315 09/26/14 0338 09/26/14 0832  NA 125* 132*  --  132* 134* 135*  K 8.3* >7.7*  --  6.2* 5.5* 4.6  CL 106 92*  --  94* 100 104  CO2  --  <7*  --  7* 10* 17*  GLUCOSE >700* 735*  --  646* 448* 255*  BUN 53* 36*  --  36* 33* 27*  CREATININE 1.60* 1.43*  --  1.51* 1.44* 1.24  CALCIUM  --  8.9  --  8.6 8.7 8.4  MG  --   --  2.7*  --  2.4  --   PHOS  --   --  7.5*  --  3.0  --    Estimated Creatinine Clearance: 85.7 ml/min (by C-G formula based on Cr of 1.24).   LIVER  Recent Labs Lab 09/25/14 2120  AST 41*  ALT 64*  ALKPHOS 123*  BILITOT 0.4  PROT 7.2  ALBUMIN 3.6     INFECTIOUS  Recent Labs Lab 09/25/14 1932  LATICACIDVEN 6.78*     ENDOCRINE CBG (last 3)   Recent Labs  09/26/14 0457 09/26/14 0553 09/26/14 0658  GLUCAP 359* 328* 305*         IMAGING x48h Dg Chest Port 1 View  09/25/2014   CLINICAL DATA:  Chest pain today.  Smoker.  EXAM: PORTABLE CHEST - 1 VIEW  COMPARISON:  08/15/2014.  FINDINGS: Normal sized heart. Clear lungs. Diffuse peribronchial thickening and  accentuation of the interstitial markings. Cervical spine fixation hardware.  IMPRESSION: No acute abnormality.  Chronic bronchitic changes.   Electronically Signed   By: Enrique Sack M.D.   On: 09/25/2014 20:49        ASSESSMENT / PLAN:  PULMONARY A: Tobacco abuse disorder(smokeless tobacco)  P:   Supplemental O2 as needed to maintain SpO2 greater than 92% Tobacco  cessation counseling  CARDIOVASCULAR A:  Tachycardia - sinus Epigastric pain, no obvious ischemic changes on ECG  P:  Troponin pending, trend Cardiology has seen and no acute issues Holding statin for now  RENAL  Recent Labs Lab 09/25/14 2315 09/26/14 0338 09/26/14 0832  K 6.2* 5.5* 4.6    Recent Labs Lab 09/25/14 2315 09/26/14 0338 09/26/14 0832  NA 132* 134* 135*   .   A:   High AG metabolic acidosis - lactic, ketoacidosis AKI Hyperkalemia - treated in ED with insulin, calcium(resolved) Dehydration Pseudohyponatremia  P:   NS bolus 2 Liters(done) Then run NS at 128m/hr Transition to D5 per DKA protocol Serial BMPs Defer further K correction to DKA protocol(resolved) Transition to SSI when meets criteria F/u UA  GASTROINTESTINAL A:   Nausea, vomiting  P:   Advance diet SUP: IV protonix PRN Zofran  HEMATOLOGIC A:   Leukocytosis  P:  Follow CBC  INFECTIOUS A:   SIRS, no obvious source  P:   BCx2 10/22 >>> UC 10/22 >>> Monitor off ABX for now with low threshold to initiate in setting of decline or fever.  Check virus panel Check PCT  ENDOCRINE A:   Diabetic Ketoacidosis H/o DM1 on insulin pump   P:   Insulin gtt per DKA/glucostabilizer protocol CBG monitoring Will need to be seen by diabetes coordinator/endocrinology in follow up - Insulin pump likely malfunctioning.  Dr. RVirgina Jockwill pick him once he is on floor. They manage his pump.  NEUROLOGIC A:  Acute metabolic encephalopathy in setting of profound acidemia. (resolved)  P:   RASS goal:  1 Monitor   FAMILY  - Updates: Wife 10/23.     TODAY'S  sTAFF MD SUMMARY: Personally evaluated patient. Met with him and wife. 89year old male with PMH of DM and hyperlipidemia presents 10/22 with DKA.  Etiology suspected as recurrent pump malfunction. He has lactic acidosis as well.  Will check PCT and repeat lactate.  If PCT high, start abx. Will monitor in ICU overnight with IV insulin and IVF resuscitation. Transition per protocol to SSI ; DM coordinator proposes moving to home pump and reassessing.  Wife has been instructed to call manufacturer - see DM coordinator note. Rest per NP  Dr. Virgina Jock to take over when out of ICU   [facility] Minor ACNP Maryanna Shape PCCM Pager [phone] till 3 pm If no answer page [phone] 09/26/2014, 9:57 AM   Dr. Brand Males, M.D., [facility].C.P Pulmonary and Critical Care Medicine Staff Physician [facility] Pager: [phone], If no answer or between  15:00h - 7:00h: call [phone]  09/26/2014 11:12 AM

## 2014-09-26 NOTE — Progress Notes (Signed)
CARE MANAGEMENT NOTE 09/26/2014  Patient:  Myra RudeFOX,Leander G   Account Number:  [id number]  Date Initiated:  09/26/2014  Documentation initiated by:  Kenedie Dirocco  Subjective/Objective Assessment:   hyperglycemia greater than 600 requiring iv insulin infusion for control     Action/Plan:   home when stable   Anticipated DC Date:  09/29/2014   Anticipated DC Plan:  HOME/SELF CARE  In-house referral  NA      DC Planning Services  NA      [facility]  [facility]   Choice offered to / List presented to:  NA   DME arranged  NA      DME agency  NA     HH arranged  NA      HH agency  NA   Status of service:  In process, will continue to follow Medicare Important Message given?   (If response is "NO", the following Medicare IM given date fields will be blank) Date Medicare IM given:   Medicare IM given by:   Date Additional Medicare IM given:   Additional Medicare IM given by:    Discharge Disposition:    Per UR Regulation:  Reviewed for med. necessity/level of care/duration of stay  If discussed at Long Length of Stay Meetings, dates discussed:    Comments:  [id number]/Tremeka Helbling,RN,BSN,CCM: Chart review for ur and dc needs.

## 2014-09-26 NOTE — Progress Notes (Signed)
ANTICOAGULATION CONSULT NOTE - Initial Consult  Pharmacy Consult for Heparin Indication: r/o MI  Allergies  Allergen Reactions  . Sulfa Antibiotics     Flu like symptoms    Patient Measurements: Height: 6\' 1"  (185.4 cm) Weight: 220 lb 3.8 oz (99.9 kg) IBW/kg (Calculated) : 79.9  Vital Signs: Temp: 98.6 F (37 C) (10/23 0800) Temp Source: Oral (10/23 0800) BP: 109/58 mmHg (10/23 1000) Pulse Rate: 107 (10/23 1000)  Labs:  Recent Labs  09/25/14 1919 09/25/14 2012 09/25/14 2013  09/25/14 2315 09/26/14 0338 09/26/14 0832 09/26/14 1136  HGB 15.8  --  16.7  --   --  15.0  --   --   HCT 50.0  --  49.0  --   --  44.5  --   --   PLT 436*  --   --   --   --  323  --   --   CREATININE  --   --  1.60*  < > 1.51* 1.44* 1.24  --   TROPONINI  --  <0.30  --   --   --   --   --  7.53*  < > = values in this interval not displayed.  Estimated Creatinine Clearance: 85.7 ml/min (by C-G formula based on Cr of 1.24).   Medical History: Past Medical History  Diagnosis Date  . Diabetes mellitus without complication   . Hypertension     Medications:  Scheduled:  . [START ON 09/27/2014] Influenza vac split quadrivalent PF  0.5 mL Intramuscular Tomorrow-1000  . pantoprazole (PROTONIX) IV  40 mg Intravenous Q24H   Infusions:  . sodium chloride Stopped (09/26/14 1028)  . dextrose 5 % and 0.45% NaCl 75 mL/hr at 09/26/14 1028  . insulin (NOVOLIN-R) infusion 10 Units/hr (09/26/14 1125)   PRN: dextrose, ondansetron (ZOFRAN) IV  Assessment: 53 y/o M with Type 1 DM admitted with DKA, has elevated troponin and EKG demonstrating lateral infarct, age undetermined.  Orders received to begin IV heparin infusion with pharmacy dosing assistance.   Goal of Therapy:  Heparin level 0.3 - 0.7 Monitor platelets by anticoagulation protocol: Yes   Plan:  1. DC SQ heparin. 2. PT, aPTT stat. 3. Heparin 3000 units IV x 1 stat. 4. Heparin 1150 units/hr IV infusion. 5. Heparin level today at 7:30  pm. 6. Daily heparin level and CBC.  Elie Goodyandy Dublin Cantero, PharmD, BCPS Pager: [phone] 09/26/2014  12:54 PM

## 2014-09-26 NOTE — Progress Notes (Signed)
  Echocardiogram 2D Echocardiogram has been performed.  Arvil ChacoFoster, Alton Tremblay 09/26/2014, 2:50 PM

## 2014-09-26 NOTE — Progress Notes (Signed)
eLink Physician-Brief Progress Note Patient Name: Myra RudeCharles G Adames DOB: 1961/03/25 MRN: [id number]   Date of Service  09/26/2014  HPI/Events of Note    eICU Interventions  Transition from IV insulin to SQ     Intervention Category Evaluation Type: Other  Billy FischerDavid Forestine Macho 09/26/2014, 4:35 PM

## 2014-09-26 NOTE — Progress Notes (Addendum)
    Patient seen earlier this morning and currently. No chest pain.  Troponin now abnormal (7).  Echo shows normal EF 70% with no wall motion abnormality. ECG now is normal with no ST changes. Prior ECG with diffuse ST changes (consider global ischemia).  It is entirely possible that this troponin elevation is a result of his underlying severe metabolic derangement (DKA) given his lack of anginal symptoms (demand ischemia/ supply demand mismatch).  Given his diabetes and now elevated troponin which portends risk, I would like to proceed with cardiac workup with cardiac cath on Monday to define anatomy. Could have 3v CAD.   Explained to patient. Willing to proceed.   It is not unreasonable to continue heparin for the next 24-48 hours.   Cath lab has been notified.   Donato SchultzSKAINS, Rosela Supak, MD

## 2014-09-26 NOTE — Progress Notes (Signed)
Central telemetry unable to see patient's heart rhythm or record strip d/t admission overnight during a system outage. RN also unable to record a strip this AM, but able to see heart rhythm in patient's room. He has been Sinus Tachy throughout the AM. [employer] came to room and assessed, fixed the issue. Strip submitted at that time. 12 lead EKG was also drawn in ED and today (10/23) for reference.

## 2014-09-26 NOTE — Progress Notes (Signed)
PHARMACY BRIEF NOTE - Drug Level Result  Consult for:  IV Heparin Indication:  R/o MI  With the infusion of 1150 units/hr, the Heparin level drawn at 19:54 is reported as < 0.10.  The patient's nurse states that there has been no interruption of the Heparin infusion, and there have been no problems with the IV site.  The therapeutic range for the Heparin level is 0.3-0.7 units/ml.  Plan:  Bolus with 3000 units.   Increase the infusion rate to 1600 units/hr.  Repeat the level 6 hours after the rate has changed.  Polo Rileylaire Ailany Koren R.Ph. 09/26/2014 9:52 PM

## 2014-09-26 NOTE — Progress Notes (Signed)
Inpatient Diabetes Program Recommendations  AACE/ADA: New Consensus Statement on Inpatient Glycemic Control (2013)  Target Ranges:  Prepandial:   less than 140 mg/dL      Peak postprandial:   less than 180 mg/dL (1-2 hours)      Critically ill patients:  140 - 180 mg/dL     Results for Ian Houston, [name] (MRN [id number]) as of 09/26/2014 08:32  Ref. Range 09/25/2014 21:20  Sodium Latest Range: 137-147 mEq/L 132 (L)  Potassium Latest Range: 3.7-5.3 mEq/L >7.7 (HH)  Chloride Latest Range: 96-112 mEq/L 92 (L)  CO2 Latest Range: 19-32 mEq/L <7 (LL)  BUN Latest Range: 6-23 mg/dL 36 (H)  Creatinine Latest Range: 0.50-1.35 mg/dL 5.781.43 (H)  Calcium Latest Range: 8.4-10.5 mg/dL 8.9  GFR calc non Af Amer Latest Range: >90 mL/min 55 (L)  GFR calc Af Amer Latest Range: >90 mL/min 63 (L)  Glucose Latest Range: 70-99 mg/dL 469735 (HH)     Admitted with DKA.  MD is questioning if patient's insulin pump malfunctioned.  Home DM Meds: Insulin Pump  PCP: Dr. Timothy Lassousso with [facility]  Current DM Meds: IV Insulin drip per GlucoStabilizer    **Note patient was admitted to hospital on 08/16/14 with DKA as well.  **Will see patient today to determine issues with insulin pump.     Will follow Ambrose FinlandJeannine Johnston Emoni Yang RN, MSN, CDE Diabetes Coordinator Inpatient Diabetes Program Team Pager: [phone] (8a-10p)

## 2014-09-26 NOTE — Progress Notes (Signed)
    Consult from earlier this AM reviewed. K has decreased with correction of acidosis. No CP, no SOB ECG abnormalities -- metabolic derangement.  No further cardiac testing at this time.  Will be happy to see him in the future as outpatient.   Will sign off. Call with any ?Marland Kitchen.  Donato SchultzSKAINS, Vidal Lampkins, MD

## 2014-09-26 NOTE — Progress Notes (Signed)
CRITICAL VALUE ALERT  Critical value received: CO2 7, Glucose 646  Date of notification: 09/26/14  Time of notification:  0000  *Critical value read back:Yes.    Nurse who received alert:  Josephina ShihShanda Louetta Hollingshead, RN  MD notified (1st page):  Elink Dr. Molli KnockYacoub  Time of first page/Response:  0005

## 2014-09-26 NOTE — Progress Notes (Addendum)
cCRITICAL VALUE ALERT  Critical value received: Potassium 7.7  Date of notification:  09/26/14  Time of notification:  2220  Critical value read back:Yes.    Nurse who received alert:  Rhunette Croftashanda Ivorie Uplinger, RN (notified by emergency dept. RN M.Teup)  MD notified (1st page):  Elink (Dr. Molli KnockYacoub)  Time of first page:  2220  Time MD responded:  2230

## 2014-09-26 NOTE — Consult Note (Signed)
CARDIOLOGY CONSULT NOTE   Patient ID: Ian Houston MRN: [id number], DOB/AGE: 1961-07-07   Admit date: 09/25/2014 Date of Consult: 09/26/2014   Primary Physician: Gwen PoundsUSSO,JOHN M, MD Primary Cardiologist: None  CC: EKG changes  Problem List  Past Medical History  Diagnosis Date  . Diabetes mellitus without complication   . Hypertension     Past Surgical History  Procedure Laterality Date  . Back surgery       Allergies  Allergies  Allergen Reactions  . Sulfa Antibiotics     Flu like symptoms    HPI  66M with a 20 year history of DM1 who presented earlier today with generalized weakness, fatigue, and malaise and one episode of nausea and vomiting. When he came to the ED his BG>700 and K 8.3. EKG showed peaked T-waves but lateral STE, so we were consulted for management options.  The patient described mild CP on ROS but otherwise described no CP or shortness of breath. He endorsed nausea and vomiting. He is otherwise active. He has never had chest discomfort in the past and does not receive regular cardiovascular care.  Inpatient Medications  . heparin  5,000 Units Subcutaneous 3 times per day  . pantoprazole (PROTONIX) IV  40 mg Intravenous Q24H    Family History No family history on file.   Social History History   Social History  . Marital Status: Married    Spouse Name: N/A    Number of Children: N/A  . Years of Education: N/A   Occupational History  . Not on file.   Social History Main Topics  . Smoking status: Current Every Day Smoker  . Smokeless tobacco: Current User    Types: Chew  . Alcohol Use: Yes     Comment: daily  . Drug Use: Not on file  . Sexual Activity: Not on file   Other Topics Concern  . Not on file   Social History Narrative  . No narrative on file     Review of Systems  General:  No chills, fever, night sweats or weight changes.  Cardiovascular:  No chest pain, dyspnea on exertion, edema, orthopnea, palpitations,  paroxysmal nocturnal dyspnea. Dermatological: No rash, lesions/masses Respiratory: No cough, dyspnea Urologic: No hematuria, dysuria Abdominal:   +Nausea, vomiting, no diarrhea, bright red blood per rectum, melena, or hematemesis Neurologic:  No visual changes,  changes in mental status. +Fatigue All other systems reviewed and are otherwise negative except as noted above.  Physical Exam  Blood pressure 97/60, pulse 117, temperature 98.9 F (37.2 C), temperature source Oral, resp. rate 22, height 6\' 1"  (1.854 m), weight 220 lb 3.8 oz (99.9 kg), SpO2 98.00%.  General: Pleasant, somnolent Psych: Normal affect. somnolent Neuro: Alert and oriented X 3. Moves all extremities spontaneously. HEENT: Normal  Neck: Supple without bruits or JVD. Lungs:  Resp regular and unlabored, CTA. Heart: RRR no s3, s4, or murmurs. Abdomen: Soft, non-tender, non-distended, BS + x 4.  Extremities: No clubbing, cyanosis or edema. DP/PT/Radials 2+ and equal bilaterally.  Labs   Recent Labs  09/25/14 2012  TROPONINI <0.30   Lab Results  Component Value Date   WBC 35.6* 09/25/2014   HGB 16.7 09/25/2014   HCT 49.0 09/25/2014   MCV 94.7 09/25/2014   PLT 436* 09/25/2014    Recent Labs Lab 09/25/14 2120 09/25/14 2315  NA 132* 132*  K >7.7* 6.2*  CL 92* 94*  CO2 <7* 7*  BUN 36* 36*  CREATININE 1.43* 1.51*  CALCIUM  8.9 8.6  PROT 7.2  --   BILITOT 0.4  --   ALKPHOS 123*  --   ALT 64*  --   AST 41*  --   GLUCOSE 735* 646*   No results found for this basename: CHOL, HDL, LDLCALC, TRIG   No results found for this basename: DDIMER    Radiology/Studies  Dg Chest Port 1 View  09/25/2014   CLINICAL DATA:  Chest pain today.  Smoker.  EXAM: PORTABLE CHEST - 1 VIEW  COMPARISON:  08/15/2014.  FINDINGS: Normal sized heart. Clear lungs. Diffuse peribronchial thickening and accentuation of the interstitial markings. Cervical spine fixation hardware.  IMPRESSION: No acute abnormality.  Chronic  bronchitic changes.   Electronically Signed   By: Gordan PaymentSteve  Reid M.D.   On: 09/25/2014 20:49    ECG 09/25/2014 7:13p: ST@115bpm , lateral STE with inferior depression, markedly peaked T-waves  09/25/2014 9:25p: ST @ 120bpm, lateral STE improving peaked t-waves improving  ASSESSMENT AND PLAN 25M with DM1 presenting with DKA and hyperkalemia, found to have marked T-wave changes. His clinical presentation and EKG are not consistent with STEMI. He describes very minimal chest discomfort. He has had symptoms for 12 hours and has 2 sets of negative troponins. His EKG is classically consistent with hyperkalemia. At this point I would continue to treat his metabolic derangements. Please obtain an EKG once his K has normalized. As an outpatient he would benefit from generalized cardiovascular risk assessment due to his DM1.  Signed, Sammuel HinesVORA, AMIT N, MD MPH 09/26/2014, 1:12 AM   Note reviewed.  Donato SchultzSKAINS, MARK, MD

## 2014-09-26 NOTE — Progress Notes (Signed)
CRITICAL VALUE ALERT  Critical value received: CO2-10  Date of notification:  09/26/14  Time of notification:  0430  Critical value read back:Yes.    Nurse who received alert:  S.Mouhamad Teed, RN  MD notified (1st page): Elink Dr. Molli KnockYacoub  Time of first page/response: [phone]    *

## 2014-09-26 NOTE — Progress Notes (Addendum)
Inpatient Diabetes Program Recommendations  AACE/ADA: New Consensus Statement on Inpatient Glycemic Control (2013)  Target Ranges:  Prepandial:   less than 140 mg/dL      Peak postprandial:   less than 180 mg/dL (1-2 hours)      Critically ill patients:  140 - 180 mg/dL     **Spoke with patient and his wife about events of last 24 hours.  Patient told me he woke up on Thursday and his CBG was around 200 mg/dl.  Did not feel well that AM.  Went to work and had to come home b/c he felt so bad.  Did not notice frequent urination or thirst.  Did have some diarrhea.  CBG meter was in the car and he did not feel well enough to go get it.  When wife came home from work she checked his CBG and it read "High".  Brought to ED with diagnosis of DKA.  Patient stated to me that he felt like he had the flu.    **Suspect patient either was going into early DKA on his way to work and that is why he felt so bad, or, patient is getting sick and his illness precipitated this episode of DKA.  Of note, patient was just hospitalized on 08/16/14 with DKA.  Patient told me this is only the second time he has had DKA since his diagnosis in his early 7220s.  **Encouraged patient and his wife to call the manufacturer of the insulin pump (1-800# on back of pump) to run a safety check to see if pump somehow malfunctioned.  Patient and wife stated they do not suspect a site issue b/c they did not smell any insulin that could have been leaking from the insertion site (patient stated last admission for DKA, he noticed his shirt was wet and they smelled insulin and he thinks he may have had a site failure which led to that episode of DKA).  **Instructed patient and wife that when MD allows patient to resume his insulin pump that he needs to use all new supplies (tubing, reservoir, site, insulin, etc).  Pt stated he understands.  Wife brought all supplies to hospital.  **Asked patient if he has Rxs for basal insulin and Novolog at  home.  Patient told me he has both Levemir and Humalog.  Instructed patient that if her ever has troubles with his pump and it malfunctions that he would need to take Levemir 46 units daily in addition to using his Humalog at home to correct his CBGs and cover food intake.  Patient stated he understood.    MD- Once Anion Gap closed and CO2 improved and patient ready to come off IV insulin drip, we can do 1 of 2 things:  1. Have patient resume his insulin pump with all new supplies.  Have patient restart his pump and continue IV insulin drip for 1-2 hours and then d/c IV insulin drip.  OR  2. Give patient Levemir and Novolog and have patient follow up with Dr. Timothy Lassousso for pump issues Levemir 46 units daily Novolog Sensitive SSI Novolog Meal Coverage- Have RNs give 1 unit Novolog for every 8 grams of carbohydrates that patient eats (patient can tell RN how much food coverage he needs)   Patient's insulin pump settings are as follows=  Basal Rates in units/hr: Midnight- 1.5  2:30 AM- 2.0 5 AM- 1.9 8 AM- 1.5 10 AM- 1.725 2:30 PM- 2.25  Patient gets 46.59 units total basal insulin per 24  hour period  Carbohydrate Ratio is 1 unit for every 8 grams of carbs consumed  Correction Factor is 1 unit for every 50 mg/dl above target CBG  Target CBG= 80 mg/dl     Will follow Ambrose FinlandJeannine Johnston Andrell Bergeson RN, MSN, CDE Diabetes Coordinator Inpatient Diabetes Program Team Pager: [phone] (8a-10p)

## 2014-09-27 DIAGNOSIS — E1010 Type 1 diabetes mellitus with ketoacidosis without coma: Secondary | ICD-10-CM

## 2014-09-27 DIAGNOSIS — E872 Acidosis: Secondary | ICD-10-CM

## 2014-09-27 LAB — CBC
HEMATOCRIT: 37.2 % — AB (ref 39.0–52.0)
Hemoglobin: 13 g/dL (ref 13.0–17.0)
MCH: 29.4 pg (ref 26.0–34.0)
MCHC: 34.9 g/dL (ref 30.0–36.0)
MCV: 84.2 fL (ref 78.0–100.0)
Platelets: 237 10*3/uL (ref 150–400)
RBC: 4.42 MIL/uL (ref 4.22–5.81)
RDW: 12.7 % (ref 11.5–15.5)
WBC: 14.5 10*3/uL — ABNORMAL HIGH (ref 4.0–10.5)

## 2014-09-27 LAB — GLUCOSE, CAPILLARY
GLUCOSE-CAPILLARY: 305 mg/dL — AB (ref 70–99)
Glucose-Capillary: 193 mg/dL — ABNORMAL HIGH (ref 70–99)
Glucose-Capillary: 233 mg/dL — ABNORMAL HIGH (ref 70–99)
Glucose-Capillary: 140 mg/dL — ABNORMAL HIGH (ref 70–99)

## 2014-09-27 LAB — URINE CULTURE
Colony Count: NO GROWTH
Culture: NO GROWTH

## 2014-09-27 LAB — PROCALCITONIN: PROCALCITONIN: 4.13 ng/mL

## 2014-09-27 LAB — TROPONIN I: TROPONIN I: 2.53 ng/mL — AB (ref <0.30)

## 2014-09-27 LAB — BASIC METABOLIC PANEL
ANION GAP: 12 (ref 5–15)
Anion gap: 13 (ref 5–15)
BUN: 15 mg/dL (ref 6–23)
BUN: 13 mg/dL (ref 6–23)
CALCIUM: 8.7 mg/dL (ref 8.4–10.5)
CHLORIDE: 106 meq/L (ref 96–112)
CO2: 19 mEq/L (ref 19–32)
CO2: 21 mEq/L (ref 19–32)
Calcium: 8.4 mg/dL (ref 8.4–10.5)
Chloride: 103 mEq/L (ref 96–112)
Creatinine, Ser: 0.94 mg/dL (ref 0.50–1.35)
Creatinine, Ser: 0.87 mg/dL (ref 0.50–1.35)
GFR calc Af Amer: >90 mL/min (ref >90)
GFR calc non Af Amer: >90 mL/min (ref >90)
GFR calc non Af Amer: >90 mL/min (ref >90)
GLUCOSE: 183 mg/dL — AB (ref 70–99)
Glucose, Bld: 236 mg/dL — ABNORMAL HIGH (ref 70–99)
POTASSIUM: 3.9 meq/L (ref 3.7–5.3)
Potassium: 4 mEq/L (ref 3.7–5.3)
SODIUM: 138 meq/L (ref 137–147)
Sodium: 136 mEq/L — ABNORMAL LOW (ref 137–147)

## 2014-09-27 LAB — HEPARIN LEVEL (UNFRACTIONATED)
HEPARIN UNFRACTIONATED: 1.01 [IU]/mL — AB (ref 0.30–0.70)
HEPARIN UNFRACTIONATED: 0.46 [IU]/mL (ref 0.30–0.70)
Heparin Unfractionated: 0.84 IU/mL — ABNORMAL HIGH (ref 0.30–0.70)

## 2014-09-27 LAB — MAGNESIUM: Magnesium: 2 mg/dL (ref 1.5–2.5)

## 2014-09-27 LAB — PHOSPHORUS: PHOSPHORUS: 1.8 mg/dL — AB (ref 2.3–4.6)

## 2014-09-27 LAB — LACTIC ACID, PLASMA: LACTIC ACID, VENOUS: 0.9 mmol/L (ref 0.5–2.2)

## 2014-09-27 MED ORDER — HEPARIN (PORCINE) IN NACL 100-0.45 UNIT/ML-% IJ SOLN
1300.0000 [IU]/h | INTRAMUSCULAR | Status: DC
  Administered 2014-09-27: 1300 [IU]/h via INTRAVENOUS
  Filled 2014-09-27: qty 250

## 2014-09-27 MED ORDER — HEPARIN (PORCINE) IN NACL 100-0.45 UNIT/ML-% IJ SOLN
950.0000 [IU]/h | INTRAMUSCULAR | Status: DC
  Filled 2014-09-27: qty 250

## 2014-09-27 MED ORDER — ATORVASTATIN CALCIUM 80 MG PO TABS
80.0000 mg | ORAL_TABLET | Freq: Every day | ORAL | Status: DC
  Administered 2014-09-27 – 2014-09-29 (×3): 80 mg via ORAL
  Filled 2014-09-27 (×6): qty 1

## 2014-09-27 NOTE — Progress Notes (Signed)
Report called to UplandKristan, Charity fundraiserN.  Patient transferred via wheelchair to room 1437.

## 2014-09-27 NOTE — Progress Notes (Signed)
PULMONARY / CRITICAL CARE MEDICINE   Name: Ian RudeCharles G Dyal MRN: [id number] DOB: [birth year]    ADMISSION DATE:  09/25/2014 CONSULTATION DATE:  09/25/2014  REFERRING MD :  Hoyt KochEDP Zavitz  CHIEF COMPLAINT: Weakness  INITIAL PRESENTATION: 53 year old male with PMH of DM 1 on insulin pump and DKA. Presented to ED 10/22 c/o weakness, vomiting, and myalgia. He was found to have profoundly elevated glucose levels as well as acidemia and hyperkalemia. PCCM asked to see for admission.   STUDIES:   SIGNIFICANT EVENTS: 10/22 admitted with DKA ?insulin pump failure. 10/23- off insulin drip  SUBJECTIVE:  No distress, no chest pain  VITAL SIGNS: Temp:  [97.8 F (36.6 C)-98.7 F (37.1 C)] 98.7 F (37.1 C) (10/24 0400) Pulse Rate:  [86-109] 91 (10/24 0600) Resp:  [14-24] 24 (10/24 0100) BP: (90-124)/(46-74) 124/74 mmHg (10/24 0600) SpO2:  [94 %-100 %] 96 % (10/24 0600) Weight:  [100.7 kg (222 lb 0.1 oz)] 100.7 kg (222 lb 0.1 oz) (10/24 0400) HEMODYNAMICS:   VENTILATOR SETTINGS:   INTAKE / OUTPUT:  Intake/Output Summary (Last 24 hours) at 09/27/14 0802 Last data filed at 09/27/14 0600  Gross per 24 hour  Intake 1436.01 ml  Output   1275 ml  Net 161.01 ml    PHYSICAL EXAMINATION: General: no distress Neuro:  , alert, oriented. Follows commands.  HEENT:  JVD wnl  Cardiovascular:  s1 s2 rr regular, no MRG noted Lungs: Clear breath sounds.  Abdomen:   non-tender, non-distended, placed insulin pump. Musculoskeletal:  No acute deformity or ROM limitation Skin:  Intact, warm  LABS:  PULMONARY  Recent Labs Lab 09/25/14 1925 09/25/14 2013  HCO3 6.2*  --   TCO2 6.3 7  O2SAT 70.5  --     CBC  Recent Labs Lab 09/25/14 1919 09/25/14 2013 09/26/14 0338 09/27/14 0403  HGB 15.8 16.7 15.0 13.0  HCT 50.0 49.0 44.5 37.2*  WBC 35.6*  --  27.7* 14.5*  PLT 436*  --  323 237    COAGULATION  Recent Labs Lab 09/26/14 1325  INR 0.96    CARDIAC    Recent Labs Lab  09/25/14 2012 09/26/14 1136 09/27/14 0403  TROPONINI <0.30 7.53* 2.53*   No results found for this basename: PROBNP,  in the last 168 hours   CHEMISTRY  Recent Labs Lab 09/25/14 2120 09/25/14 2127  09/26/14 0338 09/26/14 0832 09/26/14 1325 09/26/14 1640 09/26/14 1957 09/27/14 0403  NA 132*  --   < > 134* 135* 138 136* 136* 138  K >7.7*  --   < > 5.5* 4.6 4.1 4.1 4.2 3.9  CL 92*  --   < > 100 104 108 106 104 106  CO2 <7*  --   < > 10* 17* 19 19 19 19   GLUCOSE 735*  --   < > 448* 255* 145* 129* 159* 183*  BUN 36*  --   < > 33* 27* 24* 21 19 15   CREATININE 1.43*  --   < > 1.44* 1.24 1.08 1.06 1.14 0.94  CALCIUM 8.9  --   < > 8.7 8.4 8.4 8.3* 8.2* 8.4  MG  --  2.7*  --  2.4  --   --   --   --  2.0  PHOS  --  7.5*  --  3.0  --   --   --   --  1.8*  < > = values in this interval not displayed. Estimated Creatinine Clearance: 113.4 ml/min (by C-G  formula based on Cr of 0.94).   LIVER  Recent Labs Lab 09/25/14 2120 09/26/14 1325  AST 41*  --   ALT 64*  --   ALKPHOS 123*  --   BILITOT 0.4  --   PROT 7.2  --   ALBUMIN 3.6  --   INR  --  0.96     INFECTIOUS  Recent Labs Lab 09/25/14 1932 09/26/14 1136 09/27/14 0401 09/27/14 0404  LATICACIDVEN 6.78* 1.1  --  0.9  PROCALCITON  --  6.47 4.13  --      ENDOCRINE CBG (last 3)   Recent Labs  09/26/14 1759 09/26/14 1845 09/26/14 2210  GLUCAP 123* 130* 165*   IMAGING x48h Dg Chest Port 1 View  09/25/2014   CLINICAL DATA:  Chest pain today.  Smoker.  EXAM: PORTABLE CHEST - 1 VIEW  COMPARISON:  08/15/2014.  FINDINGS: Normal sized heart. Clear lungs. Diffuse peribronchial thickening and accentuation of the interstitial markings. Cervical spine fixation hardware.  IMPRESSION: No acute abnormality.  Chronic bronchitic changes.   Electronically Signed   By: Gordan PaymentSteve  Reid M.D.   On: 09/25/2014 20:49    ASSESSMENT / PLAN:  PULMONARY A: Tobacco abuse disorder(smokeless tobacco)  P:   Supplemental O2 as needed  to maintain SpO2 greater than 92% =- not needed Will ambulate check sats Tobacco  cessation counseling  CARDIOVASCULAR A:  Stress ischemia per cards  P:  Cardiology has seen for cath Monday Hep, asa Holding statin for now  RENAL  Recent Labs Lab 09/26/14 1640 09/26/14 1957 09/27/14 0403  K 4.1 4.2 3.9    Recent Labs Lab 09/26/14 1640 09/26/14 1957 09/27/14 0403  NA 136* 136* 138    A:   High AG metabolic acidosis - lactic, ketoacidosis resolving AKI Pseudohyponatremia - resolved  P:   Will chem bmet in am  On diet and off drip, no roel d5 in fluids kvo  GASTROINTESTINAL A:   Nausea, vomiting - resolved associated with dka  P:   Advance diet SUP: IV protonix -dc as on diet and nOT home med PRN Zofran  HEMATOLOGIC A:   Leukocytosis  P:  Follow CBC ambulate  INFECTIOUS A:   SIRS, no obvious source Some PCT elevation but trending down P:   BCx2 10/22 >>> UC 10/22 >>> Monitor off ABX for now with low threshold to initiate in setting of decline or fever.  Check virus panel-would not account for pct elevation if pos Has a good clinical status  ENDOCRINE A:   Diabetic Ketoacidosis - resolving H/o DM1 on insulin pump   P: no role d5 in fluids CBG monitoring Will need to be seen by diabetes coordinator/endocrinology today in house planned Dr. Timothy Lassousso will pick him once he is on floor. They manage his pump , for pump start up today as on diet, off drip, glu 160's  NEUROLOGIC A:  Acute metabolic encephalopathy in setting of profound acidemia. (resolved)  P:   ambulate Monitor   FAMILY  - Updates: Wife 10/23.    For cath Monday To [facility], will call, tele Pump restart likely  Mcarthur RossettiDaniel J. Tyson AliasFeinstein, MD, FACP Pgr: [phone] [facility]

## 2014-09-27 NOTE — Progress Notes (Signed)
ANTICOAGULATION CONSULT NOTE   Pharmacy Consult for Heparin Indication: cardiac ischemia  Allergies  Allergen Reactions  . Sulfa Antibiotics     Flu like symptoms    Patient Measurements: Height: 6\' 1"  (185.4 cm) Weight: 222 lb 0.1 oz (100.7 kg) IBW/kg (Calculated) : 79.9  Vital Signs: Temp: 98.4 F (36.9 C) (10/24 0800) Temp Source: Oral (10/24 0800) BP: 137/80 mmHg (10/24 0800) Pulse Rate: 76 (10/24 0910)  Labs:  Recent Labs  09/25/14 1919 09/25/14 2012 09/25/14 2013  09/26/14 0338 09/26/14 0832 09/26/14 1136 09/26/14 1325  09/26/14 1954 09/26/14 1957 09/27/14 0400 09/27/14 0403 09/27/14 1040  HGB 15.8  --  16.7  --  15.0  --   --   --   --   --   --   --  13.0  --   HCT 50.0  --  49.0  --  44.5  --   --   --   --   --   --   --  37.2*  --   PLT 436*  --   --   --  323  --   --   --   --   --   --   --  237  --   APTT  --   --   --   --   --   --   --  26  --   --   --   --   --   --   LABPROT  --   --   --   --   --   --   --  12.9  --   --   --   --   --   --   INR  --   --   --   --   --   --   --  0.96  --   --   --   --   --   --   HEPARINUNFRC  --   --   --   --   --   --   --   --   --  <0.10*  --  0.84*  --  1.01*  CREATININE  --   --  1.60*  < > 1.44* 1.24  --  1.08  < >  --  1.14  --  0.94 0.87  TROPONINI  --  <0.30  --   --   --   --  7.53*  --   --   --   --   --  2.53*  --   < > = values in this interval not displayed.  Estimated Creatinine Clearance: 122.5 ml/min (by C-G formula based on Cr of 0.87).   Medical History: Past Medical History  Diagnosis Date  . Diabetes mellitus without complication   . Hypertension     Medications:  Scheduled:  . aspirin  81 mg Oral Daily  . atorvastatin  80 mg Oral q1800  . insulin aspart  0-5 Units Subcutaneous QHS  . insulin aspart  0-9 Units Subcutaneous TID WC  . insulin aspart  3 Units Subcutaneous TID WC  . insulin detemir  46 Units Subcutaneous Daily  . nicotine  21 mg Transdermal Daily    Infusions:  . heparin     PRN: acetaminophen, dextrose, menthol-cetylpyridinium, ondansetron (ZOFRAN) IV  Assessment: 53 y/o M with Type 1 DM admitted with DKA, had elevated troponin and ischemic EKG changes.  Orders  were received to begin IV heparin infusion with pharmacy dosing assistance.  Today, 10/24:  Cardiology indicates event was likely demand ischemia due to severe metabolic derangements on admission.  Heparin level 1.01 - supratherapeutic and rising despite previous reduction in heparin rate to 1300 units/hr.  RN confirms infusion rate, states no bleeding apparent.  Hgb and pltc WNL  Plans for cardiac cath on Mon 10/26 noted   Goal of Therapy:  Heparin level 0.3 - 0.7 Monitor platelets by anticoagulation protocol: Yes   Plan:  1. Reduce Heparin to 950 units/hr 2. Recheck heparin level at 6pm. 3. Daily heparin level and CBC.  Elie Goodyandy Genieve Ramaswamy, PharmD, BCPS Pager: [phone] 09/27/2014  11:47 AM

## 2014-09-27 NOTE — Progress Notes (Signed)
Patient continues to complain of a sore throat.  Patient stated "I think it is from vomiting".  Mouth swabbed with antiseptic rinse.

## 2014-09-27 NOTE — Progress Notes (Signed)
PHARMACY BRIEF NOTE - Drug Level Result  Consult for:  IV Heparin Indication:  R/o MI  With the infusion of 1600 units/hr, the Heparin level drawn at 0400 is reported as 0.84 units/ml.  The patient's nurse states that there has been no interruption of the Heparin infusion, and there have been no problems with the IV site and no bleeding noted.   The therapeutic range for the Heparin level is 0.3-0.7 units/ml.  Plan:  Decrease the infusion rate to 1300 units/hr.  Repeat the level 6 hours after the rate has changed.  [name], Harve Spradley R 09/27/2014 4:59 AM

## 2014-09-27 NOTE — Progress Notes (Signed)
Patient ID: Myra RudeCharles G Tocco, male   DOB: February 09, 1961, 53 y.o.   MRN: [id number]     Subjective:    No complaints this AM  Objective:   Temp:  [97.8 F (36.6 C)-98.7 F (37.1 C)] 98.7 F (37.1 C) (10/24 0400) Pulse Rate:  [86-109] 91 (10/24 0600) Resp:  [14-24] 24 (10/24 0100) BP: (90-124)/(46-74) 124/74 mmHg (10/24 0600) SpO2:  [94 %-100 %] 96 % (10/24 0600) Weight:  [222 lb 0.1 oz (100.7 kg)] 222 lb 0.1 oz (100.7 kg) (10/24 0400)    Filed Weights   09/25/14 2150 09/25/14 2200 09/27/14 0400  Weight: 220 lb (99.791 kg) 220 lb 3.8 oz (99.9 kg) 222 lb 0.1 oz (100.7 kg)    Intake/Output Summary (Last 24 hours) at 09/27/14 0731 Last data filed at 09/27/14 0600  Gross per 24 hour  Intake 1562.21 ml  Output   1275 ml  Net 287.21 ml    Telemetry: NSR, sinus brady  Exam:  General: NAD  Resp: CTAB  Cardiac: RRR, no m/r/g, no JVD  GI: abdomen soft, NT, ND  MSK: no LE edema  Neuro: no focal deficits  Psych: appropriate affect  Lab Results:  Basic Metabolic Panel:  Recent Labs Lab 09/25/14 2127  09/26/14 0338  09/26/14 1640 09/26/14 1957 09/27/14 0403  NA  --   < > 134*  < > 136* 136* 138  K  --   < > 5.5*  < > 4.1 4.2 3.9  CL  --   < > 100  < > 106 104 106  CO2  --   < > 10*  < > 19 19 19   GLUCOSE  --   < > 448*  < > 129* 159* 183*  BUN  --   < > 33*  < > 21 19 15   CREATININE  --   < > 1.44*  < > 1.06 1.14 0.94  CALCIUM  --   < > 8.7  < > 8.3* 8.2* 8.4  MG 2.7*  --  2.4  --   --   --  2.0  < > = values in this interval not displayed.  Liver Function Tests:  Recent Labs Lab 09/25/14 2120  AST 41*  ALT 64*  ALKPHOS 123*  BILITOT 0.4  PROT 7.2  ALBUMIN 3.6    CBC:  Recent Labs Lab 09/25/14 1919 09/25/14 2013 09/26/14 0338 09/27/14 0403  WBC 35.6*  --  27.7* 14.5*  HGB 15.8 16.7 15.0 13.0  HCT 50.0 49.0 44.5 37.2*  MCV 94.7  --  88.5 84.2  PLT 436*  --  323 237    Cardiac Enzymes:  Recent Labs Lab 09/25/14 2012 09/26/14 1136  09/27/14 0403  TROPONINI <0.30 7.53* 2.53*    BNP: No results found for this basename: PROBNP,  in the last 8760 hours  Coagulation:  Recent Labs Lab 09/26/14 1325  INR 0.96    ECG:   Medications:   Scheduled Medications: . aspirin  81 mg Oral Daily  . Influenza vac split quadrivalent PF  0.5 mL Intramuscular Tomorrow-1000  . insulin aspart  0-5 Units Subcutaneous QHS  . insulin aspart  0-9 Units Subcutaneous TID WC  . insulin aspart  3 Units Subcutaneous TID WC  . insulin detemir  46 Units Subcutaneous Daily  . nicotine  21 mg Transdermal Daily  . pantoprazole (PROTONIX) IV  40 mg Intravenous Q24H     Infusions: . heparin 1,300 Units/hr (09/27/14 0459)  PRN Medications:  acetaminophen, dextrose, menthol-cetylpyridinium, ondansetron (ZOFRAN) IV     Assessment/Plan    53 yo male hx of DM1 and HTN admitted with DKA, hyperkalemia, and EKG changes  1. Demand ischemia - patient presented in DKA, severe acidosis with intiail pH 6.9 and bicarb of 6, lactic acid of 6.78, K of 8.3, AKI with Cr 1.43, WBC 35. - EKG peaked T waves, ST depressions. Repeat EKG with resolved changes. Trop peaked to 7.5, trending down to 2.5 today.  - echo LVEF 65-70%, no WMAs, mild MR - likely demand ischemia due to severe metabolic derangements on admission, with level of trop elevation there is concern for possible underlying chronic CAD ( as opposed to acute plaque rupture and ACS) - plan for cath Monday, contniue hep gtt and ASA for now. Start high dose statin, add FLP to AM labs.         Dina RichJonathan Denia Mcvicar, M.D.

## 2014-09-27 NOTE — Progress Notes (Signed)
ANTICOAGULATION CONSULT NOTE   Pharmacy Consult for Heparin Indication: cardiac ischemia  Assessment: 53 y/o M on heparin infusion per pharmacy for cardiac ischemia.  See pharmacist's note from this AM for further detail.    Heparin level now therapeutic at 0.46 after reducing infusion rate to 950 units/hr following a high level this AM.  Goal of Therapy:  Heparin level 0.3 - 0.7 Monitor platelets by anticoagulation protocol: Yes   Plan:  1. Continue Heparin 950 units/hr 2. Recheck heparin level in 6 hours to confirm level remains therapeutic on current rate.  Clance BollAmanda Tula Schryver, PharmD, BCPS Pager: [phone] 09/27/2014 8:01 PM

## 2014-09-27 NOTE — Progress Notes (Signed)
Patient ambulated completely around the ICU with RN at his side.  O2 sats remained 98% on room air.  HR 102-103.  Steady gait but complained of mild light-headedness.  No signs of distress.

## 2014-09-28 DIAGNOSIS — B3781 Candidal esophagitis: Secondary | ICD-10-CM

## 2014-09-28 DIAGNOSIS — B370 Candidal stomatitis: Secondary | ICD-10-CM

## 2014-09-28 DIAGNOSIS — R112 Nausea with vomiting, unspecified: Secondary | ICD-10-CM

## 2014-09-28 LAB — MAGNESIUM: Magnesium: 2 mg/dL (ref 1.5–2.5)

## 2014-09-28 LAB — CBC
HEMATOCRIT: 37.1 % — AB (ref 39.0–52.0)
Hemoglobin: 12.8 g/dL — ABNORMAL LOW (ref 13.0–17.0)
MCH: 29.2 pg (ref 26.0–34.0)
MCHC: 34.5 g/dL (ref 30.0–36.0)
MCV: 84.7 fL (ref 78.0–100.0)
Platelets: 199 10*3/uL (ref 150–400)
RBC: 4.38 MIL/uL (ref 4.22–5.81)
RDW: 12.6 % (ref 11.5–15.5)
WBC: 8.1 10*3/uL (ref 4.0–10.5)

## 2014-09-28 LAB — HEPARIN LEVEL (UNFRACTIONATED)
HEPARIN UNFRACTIONATED: 0.28 [IU]/mL — AB (ref 0.30–0.70)
HEPARIN UNFRACTIONATED: 0.26 [IU]/mL — AB (ref 0.30–0.70)
Heparin Unfractionated: 0.23 IU/mL — ABNORMAL LOW (ref 0.30–0.70)
Heparin Unfractionated: 0.24 IU/mL — ABNORMAL LOW (ref 0.30–0.70)

## 2014-09-28 LAB — BASIC METABOLIC PANEL
ANION GAP: 15 (ref 5–15)
BUN: 11 mg/dL (ref 6–23)
CHLORIDE: 100 meq/L (ref 96–112)
CO2: 23 meq/L (ref 19–32)
CREATININE: 0.77 mg/dL (ref 0.50–1.35)
Calcium: 8.5 mg/dL (ref 8.4–10.5)
GFR calc non Af Amer: >90 mL/min (ref >90)
Glucose, Bld: 236 mg/dL — ABNORMAL HIGH (ref 70–99)
Potassium: 3.8 mEq/L (ref 3.7–5.3)
SODIUM: 138 meq/L (ref 137–147)

## 2014-09-28 LAB — LIPID PANEL
Cholesterol: 171 mg/dL (ref 0–200)
HDL: 64 mg/dL (ref >39)
LDL CALC: 85 mg/dL (ref 0–99)
Total CHOL/HDL Ratio: 2.7 RATIO
Triglycerides: 112 mg/dL (ref <150)
VLDL: 22 mg/dL (ref 0–40)

## 2014-09-28 LAB — GLUCOSE, CAPILLARY
GLUCOSE-CAPILLARY: 292 mg/dL — AB (ref 70–99)
Glucose-Capillary: 296 mg/dL — ABNORMAL HIGH (ref 70–99)
Glucose-Capillary: 194 mg/dL — ABNORMAL HIGH (ref 70–99)
Glucose-Capillary: 188 mg/dL — ABNORMAL HIGH (ref 70–99)

## 2014-09-28 LAB — PHOSPHORUS: PHOSPHORUS: 2.6 mg/dL (ref 2.3–4.6)

## 2014-09-28 MED ORDER — SODIUM CHLORIDE 0.9 % IV SOLN: 250.0000 mL | INTRAVENOUS | Status: DC | PRN

## 2014-09-28 MED ORDER — SODIUM CHLORIDE 0.9 % IJ SOLN: 3.0000 mL | Freq: Two times a day (BID) | INTRAMUSCULAR | Status: DC

## 2014-09-28 MED ORDER — HEPARIN (PORCINE) IN NACL 100-0.45 UNIT/ML-% IJ SOLN
1400.0000 [IU]/h | INTRAMUSCULAR | Status: DC
  Administered 2014-09-29: 1400 [IU]/h via INTRAVENOUS
  Filled 2014-09-28 (×2): qty 250

## 2014-09-28 MED ORDER — HEPARIN (PORCINE) IN NACL 100-0.45 UNIT/ML-% IJ SOLN
1050.0000 [IU]/h | INTRAMUSCULAR | Status: DC
  Administered 2014-09-28: 1050 [IU]/h via INTRAVENOUS
  Filled 2014-09-28 (×2): qty 250

## 2014-09-28 MED ORDER — SODIUM CHLORIDE 0.9 % IV SOLN
1.0000 mL/kg/h | INTRAVENOUS | Status: DC
  Administered 2014-09-29: 1 mL/kg/h via INTRAVENOUS

## 2014-09-28 MED ORDER — MAGIC MOUTHWASH
5.0000 mL | Freq: Four times a day (QID) | ORAL | Status: DC
  Administered 2014-09-28 – 2014-09-30 (×5): 5 mL via ORAL
  Filled 2014-09-28 (×12): qty 5

## 2014-09-28 MED ORDER — HEPARIN (PORCINE) IN NACL 100-0.45 UNIT/ML-% IJ SOLN
1200.0000 [IU]/h | INTRAMUSCULAR | Status: DC
  Filled 2014-09-28: qty 250

## 2014-09-28 MED ORDER — ASPIRIN 81 MG PO CHEW
81.0000 mg | CHEWABLE_TABLET | ORAL | Status: AC
Start: 2014-09-29 — End: 2014-09-29
  Administered 2014-09-29: 81 mg via ORAL
  Filled 2014-09-28: qty 1

## 2014-09-28 MED ORDER — SODIUM CHLORIDE 0.9 % IJ SOLN: 3.0000 mL | INTRAMUSCULAR | Status: DC | PRN

## 2014-09-28 MED ORDER — FLUCONAZOLE 150 MG PO TABS
150.0000 mg | ORAL_TABLET | Freq: Once | ORAL | Status: AC
  Administered 2014-09-28: 150 mg via ORAL
  Filled 2014-09-28: qty 1

## 2014-09-28 NOTE — Progress Notes (Signed)
ANTICOAGULATION CONSULT NOTE   Pharmacy Consult for Heparin Indication: cardiac ischemia  Allergies  Allergen Reactions  . Sulfa Antibiotics     Flu like symptoms    Patient Measurements: Height: 6\' 1"  (185.4 cm) Weight: 222 lb 0.1 oz (100.7 kg) IBW/kg (Calculated) : 79.9  Vital Signs: Temp: 97.8 F (36.6 C) (10/25 0500) Temp Source: Oral (10/25 0500) BP: 140/88 mmHg (10/25 0500) Pulse Rate: 75 (10/25 0500)  Labs:  Recent Labs  09/25/14 2012  09/26/14 0338 09/26/14 0832 09/26/14 1136 09/26/14 1325  09/27/14 0403 09/27/14 1040 09/27/14 1802 09/28/14 0110 09/28/14 0520 09/28/14 0923  HGB  --   < > 15.0  --   --   --   --  13.0  --   --   --  12.8*  --   HCT  --   < > 44.5  --   --   --   --  37.2*  --   --   --  37.1*  --   PLT  --   --  323  --   --   --   --  237  --   --   --  199  --   APTT  --   --   --   --   --  26  --   --   --   --   --   --   --   LABPROT  --   --   --   --   --  12.9  --   --   --   --   --   --   --   INR  --   --   --   --   --  0.96  --   --   --   --   --   --   --   HEPARINUNFRC  --   --   --   --   --   --   < >  --  1.01* 0.46 0.28*  --  0.23*  CREATININE  --   < > 1.44* 1.24  --  1.08  < > 0.94 0.87  --   --  0.77  --   TROPONINI <0.30  --   --   --  7.53*  --   --  2.53*  --   --   --   --   --   < > = values in this interval not displayed.  Estimated Creatinine Clearance: 133.2 ml/min (by C-G formula based on Cr of 0.77).   Medical History: Past Medical History  Diagnosis Date  . Diabetes mellitus without complication   . Hypertension     Medications:  Scheduled:  . aspirin  81 mg Oral Daily  . atorvastatin  80 mg Oral q1800  . insulin aspart  0-5 Units Subcutaneous QHS  . insulin aspart  0-9 Units Subcutaneous TID WC  . insulin aspart  3 Units Subcutaneous TID WC  . insulin detemir  46 Units Subcutaneous Daily  . nicotine  21 mg Transdermal Daily   Infusions:  . heparin     PRN: acetaminophen,  menthol-cetylpyridinium, ondansetron (ZOFRAN) IV  Assessment: 53 y/o M with Type 1 DM admitted with DKA, had elevated troponin and ischemic EKG changes.  Orders were received to begin IV heparin infusion with pharmacy dosing assistance.  Significant Events: 10/24: Cardiology indicates event was likely demand ischemia due to severe metabolic  derangements on admission.  Today, 10/25:  Heparin level subtherapeutic (0.23) on 1050 units/hr. RN confirms infusion rate.  Hgb and pltc WNL  Plans for cardiac cath on Mon 10/26 noted   Goal of Therapy:  Heparin level 0.3 - 0.7 Monitor platelets by anticoagulation protocol: Yes   Plan:  1. Increase heparin to 1200 units/hr. 2. Recheck heparin level at 6pm. 3. Daily heparin level and CBC. 4. Plans for cardiac cath Mon 10/26 noted.  Elie Goodyandy Travian Kerner, PharmD, BCPS Pager: [phone] 09/28/2014  9:53 AM

## 2014-09-28 NOTE — Progress Notes (Signed)
PULMONARY / CRITICAL CARE MEDICINE   Name: Myra RudeCharles G Boise MRN: [id number] DOB: [birth year]    ADMISSION DATE:  09/25/2014 CONSULTATION DATE:  09/25/2014  REFERRING MD :  Hoyt KochEDP Zavitz  CHIEF COMPLAINT: Weakness  INITIAL PRESENTATION: 53 year old male with PMH of DM 1 on insulin pump and DKA. Presented to ED 10/22 c/o weakness, vomiting, and myalgia. He was found to have profoundly elevated glucose levels as well as acidemia and hyperkalemia. PCCM asked to see for admission.   STUDIES:   SIGNIFICANT EVENTS: 10/22 admitted with DKA ?insulin pump failure. 10/23- off insulin drip  SUBJECTIVE:  Sore throat , burns to swallow to mid sternal level. He blames vomiting few days ago.  VITAL SIGNS: Temp:  [97.8 F (36.6 C)-99.3 F (37.4 C)] 97.8 F (36.6 C) (10/25 0500) Pulse Rate:  [75-86] 75 (10/25 0500) Resp:  [16] 16 (10/25 0500) BP: (129-140)/(80-88) 140/88 mmHg (10/25 0500) SpO2:  [94 %-95 %] 95 % (10/25 0500) HEMODYNAMICS:   VENTILATOR SETTINGS:   INTAKE / OUTPUT:  Intake/Output Summary (Last 24 hours) at 09/28/14 1244 Last data filed at 09/28/14 0900  Gross per 24 hour  Intake 791.25 ml  Output      0 ml  Net 791.25 ml    PHYSICAL EXAMINATION: General: no distress Neuro:  , alert, oriented. Follows commands.  HEENT:  JVD wnl . Severe thrush Cardiovascular:  s1 s2 rr regular, no MRG noted Lungs: Clear breath sounds.  Abdomen:   non-tender, non-distended, placed insulin pump. Musculoskeletal:  No acute deformity or ROM limitation, very positional IV L antecub Skin:  Intact, warm  LABS:  PULMONARY  Recent Labs Lab 09/25/14 1925 09/25/14 2013  HCO3 6.2*  --   TCO2 6.3 7  O2SAT 70.5  --     CBC  Recent Labs Lab 09/26/14 0338 09/27/14 0403 09/28/14 0520  HGB 15.0 13.0 12.8*  HCT 44.5 37.2* 37.1*  WBC 27.7* 14.5* 8.1  PLT 323 237 199    COAGULATION  Recent Labs Lab 09/26/14 1325  INR 0.96    CARDIAC    Recent Labs Lab 09/25/14 2012  09/26/14 1136 09/27/14 0403  TROPONINI <0.30 7.53* 2.53*   No results found for this basename: PROBNP,  in the last 168 hours   CHEMISTRY  Recent Labs Lab 09/25/14 2120 09/25/14 2127  09/26/14 0338  09/26/14 1640 09/26/14 1957 09/27/14 0403 09/27/14 1040 09/28/14 0520  NA 132*  --   < > 134*  < > 136* 136* 138 136* 138  K >7.7*  --   < > 5.5*  < > 4.1 4.2 3.9 4.0 3.8  CL 92*  --   < > 100  < > 106 104 106 103 100  CO2 <7*  --   < > 10*  < > 19 19 19 21 23   GLUCOSE 735*  --   < > 448*  < > 129* 159* 183* 236* 236*  BUN 36*  --   < > 33*  < > 21 19 15 13 11   CREATININE 1.43*  --   < > 1.44*  < > 1.06 1.14 0.94 0.87 0.77  CALCIUM 8.9  --   < > 8.7  < > 8.3* 8.2* 8.4 8.7 8.5  MG  --  2.7*  --  2.4  --   --   --  2.0  --  2.0  PHOS  --  7.5*  --  3.0  --   --   --  1.8*  --  2.6  < > = values in this interval not displayed. Estimated Creatinine Clearance: 133.2 ml/min (by C-G formula based on Cr of 0.77).   LIVER  Recent Labs Lab 09/25/14 2120 09/26/14 1325  AST 41*  --   ALT 64*  --   ALKPHOS 123*  --   BILITOT 0.4  --   PROT 7.2  --   ALBUMIN 3.6  --   INR  --  0.96     INFECTIOUS  Recent Labs Lab 09/25/14 1932 09/26/14 1136 09/27/14 0401 09/27/14 0404  LATICACIDVEN 6.78* 1.1  --  0.9  PROCALCITON  --  6.47 4.13  --      ENDOCRINE CBG (last 3)   Recent Labs  09/27/14 2106 09/28/14 0748 09/28/14 1143  GLUCAP 140* 296* 292*   IMAGING x48h No results found.  ASSESSMENT / PLAN:  PULMONARY A: Tobacco abuse disorder(smokeless tobacco)  P:   Supplemental O2 as needed to maintain SpO2 greater than 92% =- not needed Will ambulate check sats Tobacco  cessation counseling  CARDIOVASCULAR A:  Stress ischemia per cards  P:  Cardiology has seen for cath Monday Hep, asa Holding statin for now  RENAL  Recent Labs Lab 09/27/14 0403 09/27/14 1040 09/28/14 0520  K 3.9 4.0 3.8    Recent Labs Lab 09/27/14 0403 09/27/14 1040  09/28/14 0520  NA 138 136* 138    A:   High AG metabolic acidosis - lactic, ketoacidosis resolving AKI Pseudohyponatremia - resolved  P:   Will chem bmet in am  On diet and off drip, no role d5 in fluids kvo  GASTROINTESTINAL A:   Nausea, vomiting - resolved associated with dka  P:   Advance diet SUP: IV protonix -dc as on diet and nOT home med PRN Zofran  HEMATOLOGIC A:   Leukocytosis  P:  Follow CBC ambulate  INFECTIOUS A:   SIRS, no obvious source Some PCT elevation but trending down P:   BCx2 10/22 >>> UC 10/22 >>> Monitor off ABX for now with low threshold to initiate in setting of decline or fever.  Check virus panel-would not account for pct elevation if pos Has a good clinical status  Thrush: Impressive in mouth. May contribute some to substernal discomfort. P:  Diflucan x 1 now, since cath tomorrow   ENDOCRINE A:   Diabetic Ketoacidosis - resolving H/o DM1 on insulin pump. Glucose holding 230's.    P: no role d5 in fluids CBG monitoring, esp since for cardiac cath AM 10/26 Will need to be seen by diabetes coordinator/endocrinology today in house planned Dr. Timothy Lassousso will pick him once he is on floor. They manage his pump , for pump start up today as on diet, off drip, glu 160's  NEUROLOGIC A:  Acute metabolic encephalopathy in setting of profound acidemia. (resolved)  P:   ambulate Monitor   FAMILY  - Updates: Wife 10/23.    [facility] will [facility]. Awaiting their pick-up. Pump restart likely  CD Maple HudsonYoung, MD [facility]

## 2014-09-28 NOTE — Progress Notes (Signed)
ANTICOAGULATION CONSULT NOTE   Pharmacy Consult for Heparin Indication: cardiac ischemia  Allergies  Allergen Reactions  . Sulfa Antibiotics     Flu like symptoms   Patient Measurements: Height: 6\' 1"  (185.4 cm) Weight: 222 lb 0.1 oz (100.7 kg) IBW/kg (Calculated) : 79.9  Vital Signs: Temp: 98.6 F (37 C) (10/25 1438) Temp Source: Oral (10/25 1438) BP: 141/89 mmHg (10/25 1438) Pulse Rate: 71 (10/25 1438)  Labs:  Recent Labs  09/25/14 2012  09/26/14 0338 09/26/14 0832 09/26/14 1136 09/26/14 1325  09/27/14 0403 09/27/14 1040  09/28/14 0520 09/28/14 0923 09/28/14 1615 09/28/14 1820  HGB  --   < > 15.0  --   --   --   --  13.0  --   --  12.8*  --   --   --   HCT  --   < > 44.5  --   --   --   --  37.2*  --   --  37.1*  --   --   --   PLT  --   --  323  --   --   --   --  237  --   --  199  --   --   --   APTT  --   --   --   --   --  26  --   --   --   --   --   --   --   --   LABPROT  --   --   --   --   --  12.9  --   --   --   --   --   --   --   --   INR  --   --   --   --   --  0.96  --   --   --   --   --   --   --   --   HEPARINUNFRC  --   --   --   --   --   --   < >  --  1.01*  < >  --  0.23* 0.24* 0.26*  CREATININE  --   < > 1.44* 1.24  --  1.08  < > 0.94 0.87  --  0.77  --   --   --   TROPONINI <0.30  --   --   --  7.53*  --   --  2.53*  --   --   --   --   --   --   < > = values in this interval not displayed.  Estimated Creatinine Clearance: 133.2 ml/min (by C-G formula based on Cr of 0.77).  Medical History: Past Medical History  Diagnosis Date  . Diabetes mellitus without complication   . Hypertension    Medications:  Scheduled:  . aspirin  81 mg Oral Daily  . [START ON 09/29/2014] aspirin  81 mg Oral Pre-Cath  . atorvastatin  80 mg Oral q1800  . insulin aspart  0-5 Units Subcutaneous QHS  . insulin aspart  0-9 Units Subcutaneous TID WC  . insulin aspart  3 Units Subcutaneous TID WC  . insulin detemir  46 Units Subcutaneous Daily  . magic  mouthwash  5 mL Oral QID  . nicotine  21 mg Transdermal Daily  . sodium chloride  3 mL Intravenous Q12H   Infusions:  . [START ON 09/29/2014] sodium  chloride    . heparin 1,200 Units/hr (09/28/14 1120)   Assessment: 53 y/o M with Type 1 DM admitted with DKA, had elevated troponin and ischemic EKG changes.  Orders were received to begin IV heparin infusion with pharmacy dosing assistance.  Significant Events: 10/24: Cardiology indicates event was likely demand ischemia due to severe metabolic derangements on admission.  Today, 10/25:  Heparin level subtherapeutic (0.23) on 1050 units/hr. RN confirms infusion rate.  Hgb and pltc WNL  Plans for cardiac cath on Mon 10/26 noted  Heparin level drawn at 1615 -> 0.24, level drawn 1820 -> 0.26 on 1200 units/hr (rate increased at 1120 this am)  Goal of Therapy:  Heparin level 0.3 - 0.7 Monitor platelets by anticoagulation protocol: Yes   Plan:  1. Increase heparin to 1400 units/hr. 2. Recheck heparin level with am labs 3. Daily heparin level and CBC. 4. Plans for cardiac cath Mon 10/26 noted.  Otho BellowsGreen, Xavien Dauphinais L PharmD Pager [phone] 09/28/2014, 7:25 PM

## 2014-09-28 NOTE — Progress Notes (Signed)
Patient ID: Myra RudeCharles G Needle, male   DOB: 1961-09-02, 53 y.o.   MRN: [id number]    Primary cardiologist:  Subjective:      Objective:   Temp:  [97.8 F (36.6 C)-99.3 F (37.4 C)] 97.8 F (36.6 C) (10/25 0500) Pulse Rate:  [75-92] 75 (10/25 0500) Resp:  [16-18] 16 (10/25 0500) BP: (129-140)/(70-88) 140/88 mmHg (10/25 0500) SpO2:  [94 %-98 %] 95 % (10/25 0500) Last BM Date: 09/27/14  Filed Weights   09/25/14 2150 09/25/14 2200 09/27/14 0400  Weight: 220 lb (99.791 kg) 220 lb 3.8 oz (99.9 kg) 222 lb 0.1 oz (100.7 kg)    Intake/Output Summary (Last 24 hours) at 09/28/14 0816 Last data filed at 09/28/14 0700  Gross per 24 hour  Intake 879.75 ml  Output    675 ml  Net 204.75 ml    Telemetry:  Exam:  General:  HEENT:  Resp:  Cardiac:  GI:  MSK:  Neuro:   Psych  Lab Results:  Basic Metabolic Panel:  Recent Labs Lab 09/26/14 0338  09/27/14 0403 09/27/14 1040 09/28/14 0520  NA 134*  < > 138 136* 138  K 5.5*  < > 3.9 4.0 3.8  CL 100  < > 106 103 100  CO2 10*  < > 19 21 23   GLUCOSE 448*  < > 183* 236* 236*  BUN 33*  < > 15 13 11   CREATININE 1.44*  < > 0.94 0.87 0.77  CALCIUM 8.7  < > 8.4 8.7 8.5  MG 2.4  --  2.0  --  2.0  < > = values in this interval not displayed.  Liver Function Tests:  Recent Labs Lab 09/25/14 2120  AST 41*  ALT 64*  ALKPHOS 123*  BILITOT 0.4  PROT 7.2  ALBUMIN 3.6    CBC:  Recent Labs Lab 09/26/14 0338 09/27/14 0403 09/28/14 0520  WBC 27.7* 14.5* 8.1  HGB 15.0 13.0 12.8*  HCT 44.5 37.2* 37.1*  MCV 88.5 84.2 84.7  PLT 323 237 199    Cardiac Enzymes:  Recent Labs Lab 09/25/14 2012 09/26/14 1136 09/27/14 0403  TROPONINI <0.30 7.53* 2.53*    BNP: No results found for this basename: PROBNP,  in the last 8760 hours  Coagulation:  Recent Labs Lab 09/26/14 1325  INR 0.96    ECG:   Medications:   Scheduled Medications: . aspirin  81 mg Oral Daily  . atorvastatin  80 mg Oral q1800  . insulin  aspart  0-5 Units Subcutaneous QHS  . insulin aspart  0-9 Units Subcutaneous TID WC  . insulin aspart  3 Units Subcutaneous TID WC  . insulin detemir  46 Units Subcutaneous Daily  . nicotine  21 mg Transdermal Daily     Infusions: . heparin 1,050 Units/hr (09/28/14 0754)     PRN Medications:  acetaminophen, menthol-cetylpyridinium, ondansetron (ZOFRAN) IV     Assessment/Plan    53 yo male hx of DM1 and HTN admitted with DKA, hyperkalemia, and EKG changes   1. Demand ischemia  - patient presented in DKA, severe acidosis with intiail pH 6.9 and bicarb of 6, lactic acid of 6.78, K of 8.3, AKI with Cr 1.43, WBC 35.  - EKG peaked T waves, ST depressions. Repeat EKG with resolved changes. Trop peaked to 7.5, trending down to 2.5 today.  - echo LVEF 65-70%, no WMAs, mild MR  - likely demand ischemia due to severe metabolic derangements on admission. With level of trop elevation and his multiple CAD  risk factors there is concern for possible underlying chronic CAD ( as opposed to acute plaque rupture and ACS)  - plan for cath Monday, contniue hep gtt, ASA, and high dose statin. F/u FLP.         Dina RichJonathan Branch, M.D.

## 2014-09-28 NOTE — Progress Notes (Signed)
Subjective: He feels basically back to normal now. Some sore throat from all of the vomiting he had.  Objective: Vital signs in last 24 hours: Temp:  [97.8 F (36.6 C)-99.3 F (37.4 C)] 97.8 F (36.6 C) (10/25 0500) Pulse Rate:  [75-86] 75 (10/25 0500) Resp:  [16] 16 (10/25 0500) BP: (129-140)/(80-88) 140/88 mmHg (10/25 0500) SpO2:  [94 %-95 %] 95 % (10/25 0500) Weight change:  Last BM Date: 09/27/14  Intake/Output from previous day: 10/24 0701 - 10/25 0700 In: 902.8 [P.O.:600; I.V.:302.8] Out: 675 [Urine:675] Intake/Output this shift: Total I/O In: 240 [P.O.:240] Out: -   General appearance: alert, cooperative and appears stated age Resp: clear to auscultation bilaterally Cardio: regular rate and rhythm, S1, S2 normal, no murmur, click, rub or gallop GI: soft, non-tender; bowel sounds normal; no masses,  no organomegaly Extremities: extremities normal, atraumatic, no cyanosis or edema Neurologic: Grossly normal   Lab Results:  Recent Labs  09/27/14 0403 09/28/14 0520  WBC 14.5* 8.1  HGB 13.0 12.8*  HCT 37.2* 37.1*  PLT 237 199   BMET  Recent Labs  09/27/14 1040 09/28/14 0520  NA 136* 138  K 4.0 3.8  CL 103 100  CO2 21 23  GLUCOSE 236* 236*  BUN 13 11  CREATININE 0.87 0.77  CALCIUM 8.7 8.5   CMET CMP     Component Value Date/Time   NA 138 09/28/2014 0520   K 3.8 09/28/2014 0520   CL 100 09/28/2014 0520   CO2 23 09/28/2014 0520   GLUCOSE 236* 09/28/2014 0520   BUN 11 09/28/2014 0520   CREATININE 0.77 09/28/2014 0520   CALCIUM 8.5 09/28/2014 0520   PROT 7.2 09/25/2014 2120   ALBUMIN 3.6 09/25/2014 2120   AST 41* 09/25/2014 2120   ALT 64* 09/25/2014 2120   ALKPHOS 123* 09/25/2014 2120   BILITOT 0.4 09/25/2014 2120   GFRNONAA >90 09/28/2014 0520   GFRAA >90 09/28/2014 0520     Studies/Results: No results found.  Medications: I have reviewed the patient's current medications.  Marland Kitchen. aspirin  81 mg Oral Daily  . atorvastatin  80 mg Oral  q1800  . insulin aspart  0-5 Units Subcutaneous QHS  . insulin aspart  0-9 Units Subcutaneous TID WC  . insulin aspart  3 Units Subcutaneous TID WC  . insulin detemir  46 Units Subcutaneous Daily  . nicotine  21 mg Transdermal Daily     Assessment/Plan:  Active Problems:   Diabetic ketoacidosis associated with type 1 diabetes mellitus  Resolved,  Continue insulin dosing. Restart pump after heart cath   Diabetic keto-acidosis   Demand ischemia  Hopefully just due to stress but I agree with heart cath to have definitive evaluation   Thrush of mouth and esophagus  Cont. Mouthwash.   LOS: 3 days   Ezequiel KayserPERINI,[place] Wherry A, MD 09/28/2014, 1:37 PM

## 2014-09-28 NOTE — Progress Notes (Signed)
ANTICOAGULATION CONSULT NOTE   Pharmacy Consult for Heparin Indication: cardiac ischemia  Assessment: 53 y/o M on heparin infusion per pharmacy for cardiac ischemia.  See pharmacist's note from 10/24 for further detail.    Heparin level now therapeutic at 0.28 on 950 units/hr No bleeding or IV interuptions per RN  Goal of Therapy:  Heparin level 0.3 - 0.7 Monitor platelets by anticoagulation protocol: Yes   Plan:  1. Increase heparin drip to 1050 unit/shr 2. Recheck heparin level in 6 hours    Ian Houston, Ian Houston 09/28/2014 2:06 AM

## 2014-09-29 ENCOUNTER — Encounter (HOSPITAL_COMMUNITY)
Admission: EM | Disposition: A | Discharge status: Home / Self Care | Payer: 59 | Source: Home / Self Care | Attending: Pulmonary Disease

## 2014-09-29 DIAGNOSIS — E785 Hyperlipidemia, unspecified: Secondary | ICD-10-CM

## 2014-09-29 DIAGNOSIS — I25110 Atherosclerotic heart disease of native coronary artery with unstable angina pectoris: Secondary | ICD-10-CM

## 2014-09-29 DIAGNOSIS — I214 Non-ST elevation (NSTEMI) myocardial infarction: Secondary | ICD-10-CM

## 2014-09-29 HISTORY — PX: LEFT HEART CATHETERIZATION WITH CORONARY ANGIOGRAM: SHX5451

## 2014-09-29 LAB — CBC
HCT: 38.6 % — ABNORMAL LOW (ref 39.0–52.0)
HEMOGLOBIN: 13.5 g/dL (ref 13.0–17.0)
MCH: 29.9 pg (ref 26.0–34.0)
MCHC: 35 g/dL (ref 30.0–36.0)
MCV: 85.4 fL (ref 78.0–100.0)
Platelets: 185 10*3/uL (ref 150–400)
RBC: 4.52 MIL/uL (ref 4.22–5.81)
RDW: 12.3 % (ref 11.5–15.5)
WBC: 6.8 10*3/uL (ref 4.0–10.5)

## 2014-09-29 LAB — POCT ACTIVATED CLOTTING TIME: ACTIVATED CLOTTING TIME: 405 s

## 2014-09-29 LAB — RESPIRATORY VIRUS PANEL
Adenovirus: NOT DETECTED
Influenza A H1: NOT DETECTED
Influenza A H3: NOT DETECTED
Influenza A: NOT DETECTED
Influenza B: NOT DETECTED
Metapneumovirus: NOT DETECTED
Parainfluenza 1: NOT DETECTED
Parainfluenza 2: NOT DETECTED
Parainfluenza 3: NOT DETECTED
Respiratory Syncytial Virus A: NOT DETECTED
Respiratory Syncytial Virus B: NOT DETECTED
Rhinovirus: NOT DETECTED

## 2014-09-29 LAB — MAGNESIUM: MAGNESIUM: 2.2 mg/dL (ref 1.5–2.5)

## 2014-09-29 LAB — GLUCOSE, CAPILLARY
GLUCOSE-CAPILLARY: 184 mg/dL — AB (ref 70–99)
Glucose-Capillary: 227 mg/dL — ABNORMAL HIGH (ref 70–99)
Glucose-Capillary: 179 mg/dL — ABNORMAL HIGH (ref 70–99)
Glucose-Capillary: 137 mg/dL — ABNORMAL HIGH (ref 70–99)

## 2014-09-29 LAB — COMPREHENSIVE METABOLIC PANEL
ALK PHOS: 103 U/L (ref 39–117)
ALT: 48 U/L (ref 0–53)
ANION GAP: 12 (ref 5–15)
AST: 39 U/L — ABNORMAL HIGH (ref 0–37)
Albumin: 2.9 g/dL — ABNORMAL LOW (ref 3.5–5.2)
BILIRUBIN TOTAL: 1 mg/dL (ref 0.3–1.2)
BUN: 12 mg/dL (ref 6–23)
CHLORIDE: 102 meq/L (ref 96–112)
CO2: 26 meq/L (ref 19–32)
Calcium: 8.6 mg/dL (ref 8.4–10.5)
Creatinine, Ser: 0.74 mg/dL (ref 0.50–1.35)
GFR calc non Af Amer: >90 mL/min (ref >90)
GLUCOSE: 220 mg/dL — AB (ref 70–99)
Potassium: 3.8 mEq/L (ref 3.7–5.3)
Sodium: 140 mEq/L (ref 137–147)
TOTAL PROTEIN: 6.3 g/dL (ref 6.0–8.3)

## 2014-09-29 LAB — PHOSPHORUS: Phosphorus: 3.2 mg/dL (ref 2.3–4.6)

## 2014-09-29 LAB — HEPARIN LEVEL (UNFRACTIONATED): Heparin Unfractionated: 0.36 IU/mL (ref 0.30–0.70)

## 2014-09-29 SURGERY — LEFT HEART CATHETERIZATION WITH CORONARY ANGIOGRAM
Anesthesia: LOCAL

## 2014-09-29 MED ORDER — SODIUM CHLORIDE 0.9 % IV SOLN
0.2500 mg/kg/h | INTRAVENOUS | Status: AC
  Administered 2014-09-29: 0.25 mg/kg/h via INTRAVENOUS
  Filled 2014-09-29: qty 250

## 2014-09-29 MED ORDER — METOPROLOL TARTRATE 12.5 MG HALF TABLET
12.5000 mg | ORAL_TABLET | Freq: Two times a day (BID) | ORAL | Status: DC
  Administered 2014-09-29 – 2014-09-30 (×2): 12.5 mg via ORAL
  Filled 2014-09-29 (×5): qty 1

## 2014-09-29 MED ORDER — MAGIC MOUTHWASH: ORAL | Status: DC

## 2014-09-29 MED ORDER — ACETAMINOPHEN 325 MG PO TABS: 650.0000 mg | ORAL_TABLET | Freq: Four times a day (QID) | ORAL | Status: DC | PRN

## 2014-09-29 MED ORDER — LIVING WELL WITH DIABETES BOOK
Freq: Once | Status: AC
  Administered 2014-09-29: 21:00:00
  Filled 2014-09-29: qty 1

## 2014-09-29 MED ORDER — TICAGRELOR 90 MG PO TABS
90.0000 mg | ORAL_TABLET | Freq: Two times a day (BID) | ORAL | Status: DC
  Administered 2014-09-29 – 2014-09-30 (×2): 90 mg via ORAL
  Filled 2014-09-29 (×3): qty 1

## 2014-09-29 MED ORDER — INSULIN PUMP
Freq: Three times a day (TID) | SUBCUTANEOUS | Status: DC
  Filled 2014-09-29: qty 1

## 2014-09-29 MED ORDER — METOPROLOL TARTRATE 12.5 MG HALF TABLET: 12.5000 mg | ORAL_TABLET | Freq: Two times a day (BID) | ORAL | Status: DC

## 2014-09-29 MED ORDER — NICOTINE 21 MG/24HR TD PT24: 21.0000 mg | MEDICATED_PATCH | Freq: Every day | TRANSDERMAL | Status: DC

## 2014-09-29 MED ORDER — SODIUM CHLORIDE 0.9 % IV SOLN
0.2500 mg/kg/h | INTRAVENOUS | Status: AC
  Filled 2014-09-29: qty 250

## 2014-09-29 NOTE — Progress Notes (Signed)
Inpatient Diabetes Program Recommendations  AACE/ADA: New Consensus Statement on Inpatient Glycemic Control (2013)  Target Ranges:  Prepandial:   less than 140 mg/dL      Peak postprandial:   less than 180 mg/dL (1-2 hours)      Critically ill patients:  140 - 180 mg/dL   Consult received regarding order for pt to restart his animas pump at this time Pt had already inserted and started the pump shortly before I arrived. Pt reviewed basal setting as follows:  2400-0230 - 1.5 units/hr 0230-0500 - 2 units/hr 0500-0800 - 1.9 units/hr 0800-1000 - 1.725 units/hr 1000-0230 - 2.25 units/hr  Bolus settings: Insulin:CHO ratio - 1 unit/8 grams Sensitivity factor: 1 unit/20 mg/dL greater than 161120 mg/dL (Pt states he changed the target from 80 mg/dL to 096120 mg/dL) Catheter insertion L upper abdomen. Orders entered with co-sign required after Dr Timothy Lassousso ordered without the entire order set. Will check on pt in the am.  They state that this pump has been overnighted to them to use in place of the previous pump which appeared to have malfunctioned.  Thank you, Lenor CoffinAnn Turhan Chill, RN, CNS, Diabetes Coordinator [phone])

## 2014-09-29 NOTE — Progress Notes (Signed)
Subjective: Feels normal. No new c/o CATH Today.  We discussed the events around his DKA - It appears his animas pump completely failed him.  He went 12 hrs of feeling bad before his wife made him check his sugar and it was "High" and too late.  Yvonne Kendallnimas has already sent him a new pump and he has it already programmed.   Objective: Vital signs in last 24 hours: Temp:  [97.6 F (36.4 C)-98.6 F (37 C)] 97.6 F (36.4 C) (10/26 0600) Pulse Rate:  [68-71] 71 (10/26 0600) Resp:  [16] 16 (10/26 0600) BP: (132-141)/(84-89) 132/84 mmHg (10/26 0600) SpO2:  [96 %-98 %] 98 % (10/26 0600) Weight change:  Last BM Date: 09/27/14  Intake/Output from previous day: 10/25 0701 - 10/26 0700 In: 1187.6 [P.O.:720; I.V.:467.6] Out: -  Intake/Output this shift: Total I/O In: 336.1 [I.V.:336.1] Out: -   General appearance: alert, cooperative and appears stated age Resp: clear to auscultation bilaterally Cardio: regular rate and rhythm, S1, S2 normal, no murmur, click, rub or gallop GI: soft, non-tender; bowel sounds normal; no masses,  no organomegaly Extremities: extremities normal, atraumatic, no cyanosis or edema Neurologic: Grossly normal   Lab Results:  Recent Labs  09/28/14 0520 09/29/14 0435  WBC 8.1 6.8  HGB 12.8* 13.5  HCT 37.1* 38.6*  PLT 199 185   BMET  Recent Labs  09/28/14 0520 09/29/14 0435  NA 138 140  K 3.8 3.8  CL 100 102  CO2 23 26  GLUCOSE 236* 220*  BUN 11 12  CREATININE 0.77 0.74  CALCIUM 8.5 8.6   CMET CMP     Component Value Date/Time   NA 140 09/29/2014 0435   K 3.8 09/29/2014 0435   CL 102 09/29/2014 0435   CO2 26 09/29/2014 0435   GLUCOSE 220* 09/29/2014 0435   BUN 12 09/29/2014 0435   CREATININE 0.74 09/29/2014 0435   CALCIUM 8.6 09/29/2014 0435   PROT 6.3 09/29/2014 0435   ALBUMIN 2.9* 09/29/2014 0435   AST 39* 09/29/2014 0435   ALT 48 09/29/2014 0435   ALKPHOS 103 09/29/2014 0435   BILITOT 1.0 09/29/2014 0435   GFRNONAA >90  09/29/2014 0435   GFRAA >90 09/29/2014 0435     Studies/Results: No results found.  Medications: I have reviewed the patient's current medications.  Marland Kitchen. aspirin  81 mg Oral Daily  . atorvastatin  80 mg Oral q1800  . insulin aspart  0-5 Units Subcutaneous QHS  . insulin aspart  0-9 Units Subcutaneous TID WC  . insulin aspart  3 Units Subcutaneous TID WC  . insulin detemir  46 Units Subcutaneous Daily  . magic mouthwash  5 mL Oral QID  . nicotine  21 mg Transdermal Daily  . sodium chloride  3 mL Intravenous Q12H     Assessment/Plan:    Diabetic ketoacidosis associated with type 1 diabetes mellitus  Resolved,  Continue insulin dosing. Restart pump after heart cath. If CBGs controlled - he may be d/ced by end of day?   DM1- D/c on pump only and as outpt see me and my pharm D.  Re-do DM 1 education and pump/sick rule training.  No more Invokana even if it had nothing to do c DKA   Demand ischemia  Hopefully just due to stress.  heart cath today for definitive evaluation   Thrush of mouth and esophagus  Cont. Mouthwash.   LOS: 4 days   Gwen PoundsUSSO,Adelyna Brockman M, MD 09/29/2014, 6:56 AM

## 2014-09-29 NOTE — Clinical Documentation Improvement (Signed)
Possible Clinical Conditions?   Septicemia / Sepsis Severe Sepsis Neutropenic Sepsis  SIRS Septic Shock Sepsis with UTI Sepsis due to an internal device  Bacterial infection of unknown etiology / source Other Condition  Cannot clinically Determine    Risk Factors: Sepsis due to unspecified organism per 10/23 progress notes.  Labs: 10/22: lactic acid: 6.78.  Thank You, Heywood FootmanWanda Mathews-Bethea,RN, BSN, Clinical Documentation Specialist:  [phone]  [facility]- [facility] Information Management

## 2014-09-29 NOTE — Progress Notes (Signed)
UR completed Kabrina Christiano K. Meklit Cotta, RN, BSN, MSHL, CCM  09/29/2014 1:47 PM

## 2014-09-29 NOTE — Progress Notes (Signed)
Inpatient Diabetes Program Recommendations  AACE/ADA: New Consensus Statement on Inpatient Glycemic Control (2013)  Target Ranges:  Prepandial:   less than 140 mg/dL      Peak postprandial:   less than 180 mg/dL (1-2 hours)      Critically ill patients:  140 - 180 mg/dL   Consult received regarding patient to restart his insulin pump. Pt's last dose of basal insulin given yesterday am.  If pt has all supplies here, he can restart with his new pump. Otherwise, if to be discharged today he can resume at home. Will try to see today before discharge .Thank you, Lenor CoffinAnn Trustin Chapa, RN, CNS, Diabetes Coordinator [phone])

## 2014-09-29 NOTE — Interval H&P Note (Signed)
Cath Lab Visit (complete for each Cath Lab visit)  Clinical Evaluation Leading to the Procedure:   ACS: Yes.    Non-ACS:    Anginal Classification: CCS III  Anti-ischemic medical therapy: Minimal Therapy (1 class of medications)  Non-Invasive Test Results: No non-invasive testing performed  Prior CABG: No previous CABG      History and Physical Interval Note:  09/29/2014 9:40 AM  Ian Houston  has presented today for surgery, with the diagnosis of cp  The various methods of treatment have been discussed with the patient and family. After consideration of risks, benefits and other options for treatment, the patient has consented to  Procedure(s): LEFT HEART CATHETERIZATION WITH CORONARY ANGIOGRAM (N/A) as a surgical intervention .  The patient's history has been reviewed, patient examined, no change in status, stable for surgery.  I have reviewed the patient's chart and labs.  Questions were answered to the patient's satisfaction.     Lesleigh NoeSMITH III,HENRY W

## 2014-09-29 NOTE — Progress Notes (Signed)
ANTICOAGULATION CONSULT NOTE   Pharmacy Consult for Heparin Indication: cardiac ischemia  Allergies  Allergen Reactions  . Sulfa Antibiotics     Flu like symptoms   Patient Measurements: Height: 6\' 1"  (185.4 cm) Weight: 222 lb 0.1 oz (100.7 kg) IBW/kg (Calculated) : 79.9  Vital Signs: Temp: 98.3 F (36.8 C) (10/25 2016) Temp Source: Oral (10/25 2016) BP: 134/84 mmHg (10/25 2016) Pulse Rate: 68 (10/25 2016)  Labs:  Recent Labs  09/26/14 0832 09/26/14 1136 09/26/14 1325  09/27/14 0403 09/27/14 1040  09/28/14 0520  09/28/14 1615 09/28/14 1820 09/29/14 0435  HGB  --   --   --   < > 13.0  --   --  12.8*  --   --   --  13.5  HCT  --   --   --   --  37.2*  --   --  37.1*  --   --   --  38.6*  PLT  --   --   --   --  237  --   --  199  --   --   --  185  APTT  --   --  26  --   --   --   --   --   --   --   --   --   LABPROT  --   --  12.9  --   --   --   --   --   --   --   --   --   INR  --   --  0.96  --   --   --   --   --   --   --   --   --   HEPARINUNFRC  --   --   --   < >  --  1.01*  < >  --   < > 0.24* 0.26* 0.36  CREATININE 1.24  --  1.08  < > 0.94 0.87  --  0.77  --   --   --  0.74  TROPONINI  --  7.53*  --   --  2.53*  --   --   --   --   --   --   --   < > = values in this interval not displayed.  Estimated Creatinine Clearance: 133.2 ml/min (by C-G formula based on Cr of 0.74).  Medical History: Past Medical History  Diagnosis Date  . Diabetes mellitus without complication   . Hypertension    Medications:  Scheduled:  . aspirin  81 mg Oral Daily  . atorvastatin  80 mg Oral q1800  . insulin aspart  0-5 Units Subcutaneous QHS  . insulin aspart  0-9 Units Subcutaneous TID WC  . insulin aspart  3 Units Subcutaneous TID WC  . insulin detemir  46 Units Subcutaneous Daily  . magic mouthwash  5 mL Oral QID  . nicotine  21 mg Transdermal Daily  . sodium chloride  3 mL Intravenous Q12H   Infusions:  . sodium chloride 1 mL/kg/hr (09/29/14 0414)  .  heparin 1,400 Units/hr (09/29/14 0251)   Assessment: 53 y/o M with Type 1 DM admitted with DKA, had elevated troponin and ischemic EKG changes.  Orders were received to begin IV heparin infusion with pharmacy dosing assistance.  Significant Events: 10/24: Cardiology indicates event was likely demand ischemia due to severe metabolic derangements on admission.  10/25:  Heparin level subtherapeutic (0.23)  on 1050 units/hr. RN confirms infusion rate.  Hgb and pltc WNL  Plans for cardiac cath on Mon 10/26 noted  Heparin level drawn at 1615 -> 0.24, level drawn 1820 -> 0.26 on 1200 units/hr (rate increased at 1120 this am) Today, 10/26  HL=0.36 on 1400 units/hr  No problems reported by RN Goal of Therapy:  Heparin level 0.3 - 0.7 Monitor platelets by anticoagulation protocol: Yes   Plan:  1. Continue heparin @ 1400 units/hr. 2. Plans for cardiac cath today 10/26 noted. 3.  F/u post- cath Dolores Loryanticoagulatin  Lucy Woolever R 09/29/2014, 5:41 AM

## 2014-09-29 NOTE — H&P (View-Only) (Signed)
Patient ID: Ian RudeCharles G Houston, male   DOB: Oct 09, 1961, 53 y.o.   MRN: [id number]    Primary cardiologist: Dr Anne FuSkains  Subjective:   No chest pain or dyspnea.   Objective:   Temp:  [97.6 F (36.4 C)-98.6 F (37 C)] 97.6 F (36.4 C) (10/26 0600) Pulse Rate:  [68-71] 71 (10/26 0600) Resp:  [16] 16 (10/26 0600) BP: (132-141)/(84-89) 132/84 mmHg (10/26 0600) SpO2:  [96 %-98 %] 98 % (10/26 0600) Last BM Date: 09/27/14  Filed Weights   09/25/14 2150 09/25/14 2200 09/27/14 0400  Weight: 220 lb (99.791 kg) 220 lb 3.8 oz (99.9 kg) 222 lb 0.1 oz (100.7 kg)    Intake/Output Summary (Last 24 hours) at 09/29/14 0704 Last data filed at 09/29/14 0600  Gross per 24 hour  Intake 1187.63 ml  Output      0 ml  Net 1187.63 ml    Telemetry: Sinus with rare PVC  Exam:  General:WD/WN NAD  HEENT:Normal   Resp: CTA  Cardiac:RRR  GI:Abd soft, NT/ND  Neuro: Grossly intact  Ext: no edema  Lab Results:  Basic Metabolic Panel:  Recent Labs Lab 09/27/14 0403 09/27/14 1040 09/28/14 0520 09/29/14 0435  NA 138 136* 138 140  K 3.9 4.0 3.8 3.8  CL 106 103 100 102  CO2 19 21 23 26   GLUCOSE 183* 236* 236* 220*  BUN 15 13 11 12   CREATININE 0.94 0.87 0.77 0.74  CALCIUM 8.4 8.7 8.5 8.6  MG 2.0  --  2.0 2.2    Liver Function Tests:  Recent Labs Lab 09/25/14 2120 09/29/14 0435  AST 41* 39*  ALT 64* 48  ALKPHOS 123* 103  BILITOT 0.4 1.0  PROT 7.2 6.3  ALBUMIN 3.6 2.9*    CBC:  Recent Labs Lab 09/27/14 0403 09/28/14 0520 09/29/14 0435  WBC 14.5* 8.1 6.8  HGB 13.0 12.8* 13.5  HCT 37.2* 37.1* 38.6*  MCV 84.2 84.7 85.4  PLT 237 199 185    Cardiac Enzymes:  Recent Labs Lab 09/25/14 2012 09/26/14 1136 09/27/14 0403  TROPONINI <0.30 7.53* 2.53*    Coagulation:  Recent Labs Lab 09/26/14 1325  INR 0.96    ECG:   Medications:   Scheduled Medications: . aspirin  81 mg Oral Daily  . atorvastatin  80 mg Oral q1800  . insulin aspart  0-5 Units Subcutaneous  QHS  . insulin aspart  0-9 Units Subcutaneous TID WC  . insulin aspart  3 Units Subcutaneous TID WC  . insulin detemir  46 Units Subcutaneous Daily  . magic mouthwash  5 mL Oral QID  . nicotine  21 mg Transdermal Daily  . sodium chloride  3 mL Intravenous Q12H    Infusions: . sodium chloride 1 mL/kg/hr (09/29/14 0414)  . heparin 1,400 Units/hr (09/29/14 0251)    PRN Medications: sodium chloride, acetaminophen, menthol-cetylpyridinium, ondansetron (ZOFRAN) IV, sodium chloride     Assessment/Plan    53 yo male hx of DM1 and HTN admitted with DKA, hyperkalemia, and EKG changes; ruled in for MI  1. NSTEMI - patient presented in DKA, severe acidosis with intiail pH 6.9 and bicarb of 6, lactic acid of 6.78, K of 8.3, AKI with Cr 1.43, WBC 35.  - EKG peaked T waves, ST depressions. Repeat EKG with resolved changes. Trop peaked to 7.5, trended down to 2.5.  - echo LVEF 65-70%, no WMAs, mild MR  - Possible demand ischemia due to severe metabolic derangements on admission. With level of trop elevation and his  multiple CAD risk factors there is concern for possible underlying chronic CAD. - plan for cath today (risks and benefits discussed and patient agrees to proceed); contniue hep gtt, ASA, and high dose statin. Add metoprolol 12.5 mg po BID.  2. Hyperlipidemia-continue statin.        Jonathan Branch, M.D. 

## 2014-09-29 NOTE — Care Management Note (Addendum)
  Page 2 of 2   09/30/2014     1:19:48 PM CARE MANAGEMENT NOTE 09/30/2014  Patient:  Ian RudeFOX,Chadrick G   Account Number:  [id number]  Date Initiated:  09/26/2014  Documentation initiated by:  DAVIS,RHONDA  Subjective/Objective Assessment:   hyperglycemia greater than 600 requiring iv insulin infusion for control     Action/Plan:   home when stable   Anticipated DC Date:  09/29/2014   Anticipated DC Plan:  HOME/SELF CARE  In-house referral  NA      DC Planning Services  NA      PAC Choice  NA   Choice offered to / List presented to:  NA   DME arranged  NA      DME agency  NA     HH arranged  NA      HH agency  NA   Status of service:  Completed, signed off Medicare Important Message given?  YES (If response is "NO", the following Medicare IM given date fields will be blank) Date Medicare IM given:  09/29/2014 Medicare IM given by:  Khaliel Morey Date Additional Medicare IM given:   Additional Medicare IM given by:    Discharge Disposition:  HOME/SELF CARE  Per UR Regulation:  Reviewed for med. necessity/level of care/duration of stay  If discussed at Long Length of Stay Meetings, dates discussed:    Comments:  Jonathon Tan RN, BSN, MSHL, CCM  Nurse - Case Manager,  (Unit [phone])  [phone]  09/30/2014 Benefits Update: S/W LILLIE @ [employer] # [phone] tricagrelor not on formulary BRILINTA 90 MG   BID COVER- YES CO-PAY - $50.00  30 DAY SUPPLY TIER-3 DRUG PRIOR APPROVAL - NO PHARMACY : [facility] CM provided wife and patient with the current update. CM provided instructions on use of 30 day free Brilinta card and co-pay asssit card. Dispo:  Home / Self care.  Dajah Fischman RN, BSN, MSHL, CCM  Nurse - Case Manager,  (Unit [phone])  [phone]  09/29/2014 Left Heart Catheterization with Coronary Angiography and PCI Report procedure on 09/29/2014 Med Review:  ticagrelor (BRILINTA) tablet 90 mg - BENEFITS CHECK - in progress Dispo Plan:  Home /  Self care   [phone]/Rhonda Davis,RN,BSN,CCM: Chart review for ur and dc needs.

## 2014-09-29 NOTE — Progress Notes (Signed)
Patient ID: Ian Houston, male   DOB: Oct 09, 1961, 53 y.o.   MRN: [id number]    Primary cardiologist: Dr Anne FuSkains  Subjective:   No chest pain or dyspnea.   Objective:   Temp:  [97.6 F (36.4 C)-98.6 F (37 C)] 97.6 F (36.4 C) (10/26 0600) Pulse Rate:  [68-71] 71 (10/26 0600) Resp:  [16] 16 (10/26 0600) BP: (132-141)/(84-89) 132/84 mmHg (10/26 0600) SpO2:  [96 %-98 %] 98 % (10/26 0600) Last BM Date: 09/27/14  Filed Weights   09/25/14 2150 09/25/14 2200 09/27/14 0400  Weight: 220 lb (99.791 kg) 220 lb 3.8 oz (99.9 kg) 222 lb 0.1 oz (100.7 kg)    Intake/Output Summary (Last 24 hours) at 09/29/14 0704 Last data filed at 09/29/14 0600  Gross per 24 hour  Intake 1187.63 ml  Output      0 ml  Net 1187.63 ml    Telemetry: Sinus with rare PVC  Exam:  General:WD/WN NAD  HEENT:Normal   Resp: CTA  Cardiac:RRR  GI:Abd soft, NT/ND  Neuro: Grossly intact  Ext: no edema  Lab Results:  Basic Metabolic Panel:  Recent Labs Lab 09/27/14 0403 09/27/14 1040 09/28/14 0520 09/29/14 0435  NA 138 136* 138 140  K 3.9 4.0 3.8 3.8  CL 106 103 100 102  CO2 19 21 23 26   GLUCOSE 183* 236* 236* 220*  BUN 15 13 11 12   CREATININE 0.94 0.87 0.77 0.74  CALCIUM 8.4 8.7 8.5 8.6  MG 2.0  --  2.0 2.2    Liver Function Tests:  Recent Labs Lab 09/25/14 2120 09/29/14 0435  AST 41* 39*  ALT 64* 48  ALKPHOS 123* 103  BILITOT 0.4 1.0  PROT 7.2 6.3  ALBUMIN 3.6 2.9*    CBC:  Recent Labs Lab 09/27/14 0403 09/28/14 0520 09/29/14 0435  WBC 14.5* 8.1 6.8  HGB 13.0 12.8* 13.5  HCT 37.2* 37.1* 38.6*  MCV 84.2 84.7 85.4  PLT 237 199 185    Cardiac Enzymes:  Recent Labs Lab 09/25/14 2012 09/26/14 1136 09/27/14 0403  TROPONINI <0.30 7.53* 2.53*    Coagulation:  Recent Labs Lab 09/26/14 1325  INR 0.96    ECG:   Medications:   Scheduled Medications: . aspirin  81 mg Oral Daily  . atorvastatin  80 mg Oral q1800  . insulin aspart  0-5 Units Subcutaneous  QHS  . insulin aspart  0-9 Units Subcutaneous TID WC  . insulin aspart  3 Units Subcutaneous TID WC  . insulin detemir  46 Units Subcutaneous Daily  . magic mouthwash  5 mL Oral QID  . nicotine  21 mg Transdermal Daily  . sodium chloride  3 mL Intravenous Q12H    Infusions: . sodium chloride 1 mL/kg/hr (09/29/14 0414)  . heparin 1,400 Units/hr (09/29/14 0251)    PRN Medications: sodium chloride, acetaminophen, menthol-cetylpyridinium, ondansetron (ZOFRAN) IV, sodium chloride     Assessment/Plan    53 yo male hx of DM1 and HTN admitted with DKA, hyperkalemia, and EKG changes; ruled in for MI  1. NSTEMI - patient presented in DKA, severe acidosis with intiail pH 6.9 and bicarb of 6, lactic acid of 6.78, K of 8.3, AKI with Cr 1.43, WBC 35.  - EKG peaked T waves, ST depressions. Repeat EKG with resolved changes. Trop peaked to 7.5, trended down to 2.5.  - echo LVEF 65-70%, no WMAs, mild MR  - Possible demand ischemia due to severe metabolic derangements on admission. With level of trop elevation and his  multiple CAD risk factors there is concern for possible underlying chronic CAD. - plan for cath today (risks and benefits discussed and patient agrees to proceed); contniue hep gtt, ASA, and high dose statin. Add metoprolol 12.5 mg po BID.  2. Hyperlipidemia-continue statin.        Ian Houston, M.D.

## 2014-09-29 NOTE — CV Procedure (Signed)
Left Heart Catheterization with Coronary Angiography and PCI Report  Ian Houston  53 y.o.  male May 23, 1961  Procedure Date: 09/29/2014 Referring Physician: Shon Baton, M.D. Primary Cardiologist: Kirk Ruths Interventional Cardiologist: Valli Glance Blenda Bridegroom, M.D.  INDICATIONS: Non-ST elevation MI  PROCEDURE: 1. Left heart catheterization; 2. Coronary angiography; 3. Left ventriculography; 4. DES mid LAD; 5. Angioplasty diagonal #1  CONSENT:  The risks, benefits, and details of the procedure were explained in detail to the patient. Risks including death, stroke, heart attack, kidney injury, allergy, limb ischemia, bleeding and radiation injury were discussed.  The patient verbalized understanding and wanted to proceed.  Informed written consent was obtained.  PROCEDURE TECHNIQUE:  After Xylocaine anesthesia a 5 French Slender sheath was placed in the right radial artery with an angiocath and the modified Seldinger technique.  Coronary angiography was done using a 5 F JR4 and JL 3.5 cm diagnostic catheter.  Left ventriculography was done using the JR4 catheter and hand injection.   Digital review demonstrated high-grade obstruction in the mid LAD within a diffusely diseased segment. Most severe region of obstruction is 95%. This would qualify as the culprit vessel for the patient's ACS/non-STEMI MI. Additionally a relatively large diagonal #1 contains segmental 90% stenosis. The vessel diameter however is less than 2.25 mm.  After consideration we performed PCI on the LAD initially pre-dilating with a 12 mm long by 2.5 mm balloon. We positioned and deployed a 24 x 3.0 mm Promus Premiere drug-eluting stent. Postdilatation was performed with a 3.25 x 15 mm Eva balloon to 15 atm.  We then turned our attention to the first diagonal. The same wire used for LAD angioplasty was used. Balloon dilatation was performed with a 12 mm x 2.0 mm diameter balloon. 3 inflations were performed. A nice  angiographic result was obtained. TIMI grade 3 flow was noted in both territories after final balloon inflation.  A 3.5 cm XB LAD Was Used for PCI. A BMW Wire 0.014 and Bivalirudin were used. The patient was loaded with Brilinta. ACT was documented to be greater than 300 before the first balloon inflation was performed.  Hemostasis was achieved with a Vasc Band.   CONTRAST:  Total of 300 cc.  COMPLICATIONS:  None   HEMODYNAMICS:  Aortic pressure 120/73 mmHg; LV pressure 120 over 11 mmHg; LVEDP 19 mmHg  ANGIOGRAPHIC DATA:   The left main coronary artery is widely patent.  The left anterior descending artery is moderately to heavily calcified. The mid LAD contains diffuse disease within a calcified segment and obstructs the vessel up to 90% at its worst spot. This represents the culprit lesion for the patient's enzyme release. A large first diagonal in territory but small in diameter contains proximal 90% obstruction.  The left circumflex artery is is a large vessel that gives origin to 3 obtuse marginal branches. Irregularities are noted but no significant obstruction is seen..  The right coronary artery contains proximal to mid calcification. There is 2030% proximal narrowing.   PCI RESULTS: The LAD was a culprit vessel involving a diffusely diseased mid segment with up to 95% obstruction. This was treated with drug-eluting stent implantation and reduction of stenosis to less than 0% with TIMI grade 3 flow after post-dilating the 24 mm x 3.0 Promus Premiere stent to 3.25 mm in diameter.  The first diagonal was 90% and reduced to 30% after angioplasty.  LEFT VENTRICULOGRAM:  Left ventricular angiogram was done in the 30 RAO projection and revealed  60% EF with normal regional wall motion.   IMPRESSIONS:  1. Non-ST elevation myocardial infarction in setting of diabetic ketoacidosis 2. High-grade mid LAD 90-95% stenosis within a diffusely diseased calcified segment. Treatment with  angioplasty and stenting reducing the stenosis to 0% with TIMI grade 3 flow. 3. Successful antiplastic of the first diagonal 4. Widely patent circumflex and right coronary with each vessel containing luminal irregularities but no high-grade obstruction 5. Normal LV function   RECOMMENDATION:  Aspirin and Brilinta Discharge in a.m. if medical concerns are compensated Clinical follow-up in 1-2 weeks with me.

## 2014-09-30 ENCOUNTER — Encounter (HOSPITAL_COMMUNITY): Payer: Self-pay | Admitting: Physician Assistant

## 2014-09-30 LAB — PHOSPHORUS: Phosphorus: 4 mg/dL (ref 2.3–4.6)

## 2014-09-30 LAB — BASIC METABOLIC PANEL
ANION GAP: 14 (ref 5–15)
BUN: 10 mg/dL (ref 6–23)
CALCIUM: 8.7 mg/dL (ref 8.4–10.5)
CO2: 24 mEq/L (ref 19–32)
Chloride: 105 mEq/L (ref 96–112)
Creatinine, Ser: 0.76 mg/dL (ref 0.50–1.35)
GFR calc Af Amer: >90 mL/min (ref >90)
Glucose, Bld: 60 mg/dL — ABNORMAL LOW (ref 70–99)
Potassium: 3.3 mEq/L — ABNORMAL LOW (ref 3.7–5.3)
SODIUM: 143 meq/L (ref 137–147)

## 2014-09-30 LAB — CBC
HCT: 38.9 % — ABNORMAL LOW (ref 39.0–52.0)
Hemoglobin: 13.4 g/dL (ref 13.0–17.0)
MCH: 29.1 pg (ref 26.0–34.0)
MCHC: 34.4 g/dL (ref 30.0–36.0)
MCV: 84.6 fL (ref 78.0–100.0)
PLATELETS: 209 10*3/uL (ref 150–400)
RBC: 4.6 MIL/uL (ref 4.22–5.81)
RDW: 12.2 % (ref 11.5–15.5)
WBC: 7.2 10*3/uL (ref 4.0–10.5)

## 2014-09-30 LAB — GLUCOSE, CAPILLARY
GLUCOSE-CAPILLARY: 55 mg/dL — AB (ref 70–99)
Glucose-Capillary: 93 mg/dL (ref 70–99)

## 2014-09-30 LAB — MAGNESIUM: MAGNESIUM: 2.1 mg/dL (ref 1.5–2.5)

## 2014-09-30 MED ORDER — NITROGLYCERIN 0.4 MG SL SUBL
0.4000 mg | SUBLINGUAL_TABLET | SUBLINGUAL | Status: DC | PRN
Start: 2014-09-30 — End: 2014-09-30

## 2014-09-30 MED ORDER — INSULIN PUMP: SUBCUTANEOUS | Status: DC

## 2014-09-30 MED ORDER — NITROGLYCERIN 0.4 MG SL SUBL: 0.4000 mg | SUBLINGUAL_TABLET | SUBLINGUAL | Status: AC | PRN

## 2014-09-30 MED ORDER — INSULIN PUMP: SUBCUTANEOUS | Status: AC

## 2014-09-30 MED ORDER — METOPROLOL TARTRATE 12.5 MG HALF TABLET: 12.5000 mg | ORAL_TABLET | Freq: Two times a day (BID) | ORAL | Status: DC

## 2014-09-30 MED ORDER — ATORVASTATIN CALCIUM 80 MG PO TABS: 80.0000 mg | ORAL_TABLET | Freq: Every day | ORAL | Status: DC

## 2014-09-30 MED ORDER — MAGIC MOUTHWASH: ORAL | Status: DC

## 2014-09-30 MED ORDER — POTASSIUM CHLORIDE CRYS ER 20 MEQ PO TBCR
20.0000 meq | EXTENDED_RELEASE_TABLET | Freq: Once | ORAL | Status: AC
  Administered 2014-09-30: 10:00:00 20 meq via ORAL
  Filled 2014-09-30: qty 1

## 2014-09-30 MED ORDER — NICOTINE 21 MG/24HR TD PT24: MEDICATED_PATCH | TRANSDERMAL | Status: DC

## 2014-09-30 MED ORDER — TICAGRELOR 90 MG PO TABS: 90.0000 mg | ORAL_TABLET | Freq: Two times a day (BID) | ORAL | Status: DC

## 2014-09-30 MED FILL — Heparin Sodium (Porcine) 2 Unit/ML in Sodium Chloride 0.9%: INTRAMUSCULAR | Qty: 1500 | Status: AC

## 2014-09-30 MED FILL — Bivalirudin For IV Soln 250 MG: INTRAVENOUS | Qty: 250 | Status: AC

## 2014-09-30 MED FILL — Heparin Sodium (Porcine) Inj 1000 Unit/ML: INTRAMUSCULAR | Qty: 10 | Status: AC

## 2014-09-30 MED FILL — Nitroglycerin IV Soln 100 MCG/ML in D5W: INTRA_ARTERIAL | Qty: 10 | Status: AC

## 2014-09-30 MED FILL — Midazolam HCl Inj 2 MG/2ML (Base Equivalent): INTRAMUSCULAR | Qty: 2 | Status: AC

## 2014-09-30 MED FILL — Sodium Chloride IV Soln 0.9%: INTRAVENOUS | Qty: 50 | Status: AC

## 2014-09-30 MED FILL — Ticagrelor Tab 90 MG: ORAL | Qty: 2 | Status: AC

## 2014-09-30 MED FILL — Fentanyl Citrate Inj 0.05 MG/ML: INTRAMUSCULAR | Qty: 2 | Status: AC

## 2014-09-30 MED FILL — Lidocaine HCl Local Preservative Free (PF) Inj 1%: INTRAMUSCULAR | Qty: 30 | Status: AC

## 2014-09-30 MED FILL — Verapamil HCl IV Soln 2.5 MG/ML: INTRAVENOUS | Qty: 2 | Status: AC

## 2014-09-30 NOTE — Progress Notes (Signed)
Hypoglycemic Event  CBG: 55 (6:25 AM)  Treatment: 15 GM carbohydrate snack  Symptoms: None  Follow-up CBG: Time:6:49 AM CBG Result:93  Possible Reasons for Event: Inadequate meal intake  Comments/MD notified:PA Danella PentonKatie Stern    Viera Okonski Tiu  Remember to initiate Hypoglycemia Order Set & complete

## 2014-09-30 NOTE — Discharge Summary (Signed)
Physician Discharge Summary  DISCHARGE SUMMARY   Patient ID: Ian Houston MR#: [id number] DOB/AGE: 03-04-1961 53 y.o.   Attending 53 M  Patient's WUX:LKGMW,NUUV M, MD  Consults:Treatment Team:  Rounding Lbcardiology, MD Precious Reel, MD**  Admit date: 09/25/2014 Discharge date: 09/30/2014  Discharge Diagnoses:  Active Problems:   Diabetic ketoacidosis associated with type 1 diabetes mellitus   Diabetic keto-acidosis   Demand ischemia   Thrush of mouth and esophagus   Non-ST elevation MI (NSTEMI)   Patient Active Problem List   Diagnosis Date Noted  . Non-ST elevation MI (NSTEMI) 09/29/2014  . Thrush of mouth and esophagus 09/28/2014  . Demand ischemia 09/27/2014  . Diabetic ketoacidosis associated with type 1 diabetes mellitus 09/25/2014  . Diabetic keto-acidosis 09/25/2014  . DKA (diabetic ketoacidoses) 08/16/2014    Class: Acute  . Dehydration 08/16/2014    Class: Acute   Past Medical History  Diagnosis Date  . Diabetes mellitus without complication   . Hypertension   . CAD (coronary artery disease)     a. s/p NSTEMI in the setting of DKA s/p DES to D1  . Tobacco abuse     a. chewing tobacco    Discharged Condition: Improved   Discharge Medications:   Medication List    STOP taking these medications       ibuprofen 200 MG tablet  Commonly known as:  ADVIL,MOTRIN     naproxen sodium 220 MG tablet  Commonly known as:  ANAPROX      TAKE these medications       acetaminophen 325 MG tablet  Commonly known as:  TYLENOL  Take 2 tablets (650 mg total) by mouth every 6 (six) hours as needed for mild pain, moderate pain, fever or headache.     aspirin 81 MG chewable tablet  Chew 81 mg by mouth daily.     atorvastatin 80 MG tablet  Commonly known as:  LIPITOR  Take 1 tablet (80 mg total) by mouth daily at 6 PM.     insulin pump Soln  Inject into the skin every hour as needed. humalog     insulin pump Soln  bolus as directed  4-6 times per day     magic mouthwash Soln  Take 5 mLs by mouth 4 (four) times daily as needed for ST or Oral issues.  I sent Rx in electronically.  If feeling fine no reason to fill.     metoprolol tartrate 12.5 mg Tabs tablet  Commonly known as:  LOPRESSOR  Take 0.5 tablets (12.5 mg total) by mouth 2 (two) times daily.     nicotine 21 mg/24hr patch  Commonly known as:  NICODERM CQ - dosed in mg/24 hours  - Place 1 patch (21 mg total) onto the skin daily.  - Use only if needed to avoid tobacco.     ticagrelor 90 MG Tabs tablet  Commonly known as:  BRILINTA  Take 1 tablet (90 mg total) by mouth 2 (two) times daily.        Hospital Procedures: Dg Chest Port 1 View  09/25/2014   CLINICAL DATA:  Chest pain today.  Smoker.  EXAM: PORTABLE CHEST - 1 VIEW  COMPARISON:  08/15/2014.  FINDINGS: Normal sized heart. Clear lungs. Diffuse peribronchial thickening and accentuation of the interstitial markings. Cervical spine fixation hardware.  IMPRESSION: No acute abnormality.  Chronic bronchitic changes.   Electronically Signed   By: Ian Houston M.D.   On: 09/25/2014 20:49    History  of Present Illness: 53 year old male, current tobacco user with history of diabetes type 1 on insulin pump, c diabetic ketoacidosis once recently presented to med attention with vomiting, bodyaches and hyperglycemia. Patient has had gradually worsening symptoms since the morning of admit.  He woke up feeling poorly.  Did not check sugar.  Went on about day and gopt worse and worse.  Called wife Ian Houston who met him at home.  Sugar read High.  He had generalized weakness and lightheaded. He has had one episode of nonbloody vomiting, no focal abdominal pain, no respiratory symptoms, no other new symptoms. His pump had failed.  He had been off the SGLT-2 for a bit so it had no impact on sugars and DKA.  He felt like he had the flu. In ED he was found to have elevated glucose (>700), hyperkalemia (8.3), and profound acidemia  (pH 6.9 on venous gas). He was treated for hyperkalemia in ED and PCCM was called for admission.   Hospital Course:  53 male admitted c DKA d/t pump failure. He was admitted to ICU - Hyperkalemia corrected.  IVF and IV Insulin provided. He had a sig Lactic acidosis as well. He had EKG changes and ruled in for a NSTEMI He also had Leukocytosis and ? SIRS.  He was Cxed up and all Cxs (-) including Blood/Urine/Resp Virus. No Abx started or needed. His Gap closed over 1-2 days and he improved dramatically.  K also improved.  Kidney function/Cr and DeH also improved.  Sugars stabilized as he was moved to SQ Insulin.  His WBC also normalized as he improved.  He never had a fever. He called [employer] and had a new pump Overnighted to him.  He Programmed it.  After his heart Cath he was transitioned back to the pump and CBGs have been well controlled since with the exception of one low.  His diet has been advanced and he is currently doing well on 10/27. He was transferred to my service and out of the ICU on Sat 10/24. He ruled in for NSTEMI c Peak Trop I of 7.53.  This was thought to be demand ischemia.   ECHO showed: - Left ventricle: The cavity size was normal. Systolic function was vigorous. The estimated ejection fraction was in the range of 65% to 70%. Wall motion was normal; there were no regional wall motion abnormalities. - Mitral valve: There was mild regurgitation He was brought to the Cath lab on 10/26 by Dr Pernell Dupre for 1. Left heart catheterization; 2. Coronary angiography; 3. Left ventriculography; 4. DES mid LAD; 5. Angioplasty diagonal #1.  The left anterior descending artery was moderately to heavily calcified. The mid LAD contains diffuse disease within a calcified segment and obstructs the vessel up to 90% at its worst spot. This represents the culprit lesion for the patient's enzyme release. A large first diagonal in territory but small in diameter contains proximal 90% obstruction. The  left circumflex artery is is a large vessel that gives origin to 3 obtuse marginal branches. Irregularities are noted but no significant obstruction is seen. The right coronary artery contains proximal to mid calcification. There is 20-30% proximal narrowing.  1. Non-ST elevation myocardial infarction in setting of diabetic ketoacidosis  2. High-grade mid LAD 90-95% stenosis within a diffusely diseased calcified segment. Treatment with angioplasty and stenting reducing the stenosis to 0% with TIMI grade 3 flow.  3. Successful antiplastic of the first diagonal  4. Widely patent circumflex and right coronary  with each vessel containing luminal irregularities but no high-grade obstruction  5. Normal LV function Plan = Aspirin plus Brilinta and BB started the other day.  He will  f/up Cards 1-2 weeks.  Out of work until cards releases him.  He will be set up for Cardiac Rehab  He spent Overnight 10/26 being monitored.  His Wrist healed nicely.  He has no CP/SOB.  He states he quit tobacco products.  CBGs fine x mild Hypo.  BP a little high - may need ACEi started as outpt?   His Pump settings are  2400-0230 - 1.5 units/hr  0230-0500 - 2 units/hr  0500-0800 - 1.9 units/hr  0800-1000 - 1.725 units/hr  1000-0230 - 2.25 units/hr  Bolus settings:  Insulin:CHO ratio - 1 unit/8 grams  Sensitivity factor: 1 unit/20 mg/dL greater than 120 mg/dL  (Pt states he changed the target from 80 mg/dL to 120 mg/dL)  He was seen by DM coordinator in house as well.  CBGs 55-184 since pump back on LDL 85 and Lipitor increased to 80. K repleted on 10/27. He has had lots of fluids and expect some auto-diurese over the next few days.  BP should also improve    After education and discussion he is ready for D/c this am  Day of Discharge Exam BP 160/79  Pulse 81  Temp(Src) 97.9 F (36.6 C) (Oral)  Resp 18  Ht _0  (1.854 m)  Wt 97.4 kg (214 lb 11.7 oz)  BMI 28.34 kg/m2  SpO2 94%  Physical Exam: General  appearance: alert, cooperative and appears stated age  Resp: clear to auscultation bilaterally  Cardio: regular rate and rhythm, S1, S2 normal, no murmur, click, rub or gallop  GI: soft, non-tender; bowel sounds normal; no masses, no organomegaly  Extremities: extremities normal, atraumatic, no cyanosis or edema  Neurologic: Grossly normal    Discharge Labs:  Recent Labs  09/29/14 0435 09/30/14 0357  NA 140 143  K 3.8 3.3*  CL 102 105  CO2 26 24  GLUCOSE 220* 60*  BUN 12 10  CREATININE 0.74 0.76  CALCIUM 8.6 8.7  MG 2.2 2.1  PHOS 3.2 4.0    Recent Labs  09/29/14 0435  AST 39*  ALT 48  ALKPHOS 103  BILITOT 1.0  PROT 6.3  ALBUMIN 2.9*    Recent Labs  09/29/14 0435 09/30/14 0357  WBC 6.8 7.2  HGB 13.5 13.4  HCT 38.6* 38.9*  MCV 85.4 84.6  PLT 185 209   No results found for this basename: CKTOTAL, CKMB, CKMBINDEX, TROPONINI,  in the last 72 hours No results found for this basename: TSH, T4TOTAL, FREET3, T3FREE, THYROIDAB,  in the last 72 hours No results found for this basename: VITAMINB12, FOLATE, FERRITIN, TIBC, IRON, RETICCTPCT,  in the last 72 hours Lab Results  Component Value Date   INR 0.96 09/26/2014   INR 0.92 08/15/2014       Discharge instructions:  01-Home or Self Care Follow-up Information   Follow up with Precious Reel, MD In 1 week.   Specialty:  Internal Medicine   Contact information:   [address] [place] [address] [phone]       Follow up with Richardson Dopp, PA-C On 10/08/2014. (@ 2:40 PM)    Specialty:  Physician Assistant   Contact information:   [address] [address] [phone]        Disposition: home  Follow-up Appts: Follow-up with Dr. Virgina Jock at [facility] in 1 wk.  Call for appointment.  Condition on Discharge: stable  Tests Needing Follow-up: F/up labs  Time spent in discharge (includes decision making & examination of pt): 40  min  Signed: Jaydien Panepinto M 09/30/2014, 8:28 AM

## 2014-09-30 NOTE — Progress Notes (Signed)
Noted hypoglycemia this am of 60 mg/dL. This may be result of overlap of basal insulin given the night prior to start of insulin pump yesterday afternoon. CBG now normal and pt is ready for discharge. Will try to revisit before he leaves today.  Thank you, Lenor CoffinAnn Deaisha Welborn, RN, CNS, Diabetes Coordinator [phone])

## 2014-09-30 NOTE — Progress Notes (Signed)
Patient Name: Myra RudeCharles G Cory Date of Encounter: 09/30/2014     Active Problems:   Diabetic ketoacidosis associated with type 1 diabetes mellitus   Diabetic keto-acidosis   Demand ischemia   Thrush of mouth and esophagus   Non-ST elevation MI (NSTEMI)    SUBJECTIVE  Feeling well. Ready to go home.  CURRENT MEDS . aspirin  81 mg Oral Daily  . atorvastatin  80 mg Oral q1800  . insulin pump   Subcutaneous TID AC, HS, 0200  . magic mouthwash  5 mL Oral QID  . metoprolol tartrate  12.5 mg Oral BID  . nicotine  21 mg Transdermal Daily  . ticagrelor  90 mg Oral BID    OBJECTIVE  Filed Vitals:   09/29/14 2017 09/30/14 0012 09/30/14 0555 09/30/14 0813  BP: 163/80 136/53 158/82 160/79  Pulse: 65 62 73 81  Temp: 98.5 F (36.9 C) 98.2 F (36.8 C) 98 F (36.7 C) 97.9 F (36.6 C)  TempSrc: Oral Oral Oral Oral  Resp: 16 18 20 18   Height:      Weight:  214 lb 11.7 oz (97.4 kg)    SpO2: 97% 95% 96% 94%    Intake/Output Summary (Last 24 hours) at 09/30/14 0817 Last data filed at 09/30/14 [id number]  Gross per 24 hour  Intake    720 ml  Output   1500 ml  Net   -780 ml   Filed Weights   09/25/14 2200 09/27/14 0400 09/30/14 0012  Weight: 220 lb 3.8 oz (99.9 kg) 222 lb 0.1 oz (100.7 kg) 214 lb 11.7 oz (97.4 kg)    PHYSICAL EXAM  General: Pleasant, NAD. Neuro: Alert and oriented X 3. Moves all extremities spontaneously. Psych: Normal affect. HEENT:  Normal  Neck: Supple without bruits or JVD. Lungs:  Resp regular and unlabored, CTA. Heart: RRR no s3, s4, or murmurs. Abdomen: Soft, non-tender, non-distended, BS + x 4.  Extremities: No clubbing, cyanosis or edema. DP/PT/Radials 2+ and equal bilaterally.  Accessory Clinical Findings  CBC  Recent Labs  09/29/14 0435 09/30/14 0357  WBC 6.8 7.2  HGB 13.5 13.4  HCT 38.6* 38.9*  MCV 85.4 84.6  PLT 185 209   Basic Metabolic Panel  Recent Labs  09/29/14 0435 09/30/14 0357  NA 140 143  K 3.8 3.3*  CL 102 105  CO2  26 24  GLUCOSE 220* 60*  BUN 12 10  CREATININE 0.74 0.76  CALCIUM 8.6 8.7  MG 2.2 2.1  PHOS 3.2 4.0   Liver Function Tests  Recent Labs  09/29/14 0435  AST 39*  ALT 48  ALKPHOS 103  BILITOT 1.0  PROT 6.3  ALBUMIN 2.9*    Fasting Lipid Panel  Recent Labs  09/28/14 0510  CHOL 171  HDL 64  LDLCALC 85  TRIG 112  CHOLHDL 2.7     TELE  NSR  Radiology/Studies  Dg Chest Port 1 View  09/25/2014   CLINICAL DATA:  Chest pain today.  Smoker.  EXAM: PORTABLE CHEST - 1 VIEW  COMPARISON:  08/15/2014.  FINDINGS: Normal sized heart. Clear lungs. Diffuse peribronchial thickening and accentuation of the interstitial markings. Cervical spine fixation hardware.  IMPRESSION: No acute abnormality.  Chronic bronchitic changes.   Electronically Signed   By: Gordan PaymentSteve  Reid M.D.   On: 09/25/2014 20:49    ASSESSMENT AND PLAN Myra RudeCharles G Circle is a 53 y.o. male with a history of ongoing chewing tobacco abuse, HTN, DM and no prior cardiac history who [facility]  [facility] with DKA due to insulin pump failure.   NSTEMI- pk troponin 7.53 -- s/p LHC on 09/29/14 with 1. NSTEMI in setting of diabetic ketoacidosis  2. High-grade mid LAD 90-95% stenosis within a diffusely diseased calcified segment. Treatment with angioplasty and stenting reducing the stenosis to 0% with TIMI grade 3 flow.  3. Successful angioplasty of the first diagonal  4. Widely patent circumflex and right coronary with each vessel containing luminal irregularities but no high-grade obstruction  5. Normal LV function  --  Continue Aspirin and Brilinta, statin and BB -- Radial site stable . Follow up appointment arranged. Will have TOC in 1 week with Tereso NewcomerScott Weaver PA-C and follow with Dr. Anne FuSkains. Will stay out of work until medically cleared at that time.  DKA- with associated hyperkalemia now resolved.   HTN- BP currently mildly elevated. Continue to monitor   Signed, Janetta HoraHOMPSON, Shaquila Sigman R PA-C  Pager [phone]

## 2014-09-30 NOTE — Discharge Instructions (Addendum)
Diabetic Ketoacidosis °Diabetic ketoacidosis (DKA) is a life-threatening complication of type 1 diabetes. It must be quickly recognized and treated. Treatment requires hospitalization. °CAUSES  °When there is no insulin in the body, glucose (sugar) cannot be used, and the body breaks down fat for energy. When fat breaks down, acids (ketones) build up in the blood. Very high levels of glucose and high levels of acids lead to severe loss of body fluids (dehydration) and other dangerous chemical changes. This stresses your vital organs and can cause coma or death. °SIGNS AND SYMPTOMS  °· Tiredness (fatigue). °· Weight loss. °· Excessive thirst. °· Ketones in your urine. °· Light-headedness. °· Fruity or sweet smelling breath. °· Excessive urination. °· Visual changes. °· Confusion or irritability. °· Nausea or vomiting. °· Rapid breathing. °· Stomachache or abdominal pain. °DIAGNOSIS  °Your health care provider will diagnose DKA based on your history, physical exam, and blood tests. The health care provider will check to see if you have another illness that caused you to go into DKA. Most of this will be done quickly in an emergency room. °TREATMENT  °· Fluid replacement to correct dehydration. °· Insulin. °· Correction of electrolytes, such as potassium and sodium. °· Antibiotic medicines. °PREVENTION °· Always take your insulin. Do not skip your insulin injections. °· If you are sick, treat yourself quickly. Your body often needs more insulin to fight the illness. °· Check your blood glucose regularly. °· Check urine ketones if your blood glucose is greater than 240 milligrams per deciliter (mg/dL). °· Do not use outdated (expired) insulin. °· If your blood glucose is high, drink plenty of fluids. This helps flush out ketones. °HOME CARE INSTRUCTIONS  °· If you are sick, follow the advice of your health care provider. °· To prevent dehydration, drink enough water and fluids to keep your urine clear or pale  yellow. °¨ If you cannot eat, alternate between drinking fluids with sugar (soda, juices, flavored gelatin) and salty fluids (broth, bouillon). °¨ If you can eat, follow your usual diet and drink sugar-free liquids (water, diet drinks). °· Always take your usual dose of insulin. If you cannot eat or if your glucose is getting too low, call your health care provider for further instructions. °· Continue to monitor your blood or urine ketones every 3-4 hours around the clock. Set your alarm clock or have someone wake you up. If you are too sick, have someone test it for you. °· Rest and avoid exercise. °SEEK MEDICAL CARE IF:  °· You have a fever. °· You have ketones in your urine, or your blood glucose is higher than a level your health care provider suggests. You may need extra insulin. Call your health care provider if you need advice on adjusting your insulin. °· You cannot drink at least a tablespoon (15 mL) of fluid every 15-20 minutes. °· You have been vomiting for more than 2 hours. °· You have symptoms of DKA: °¨ Fruity smelling breath. °¨ Breathing faster or slower. °¨ Becoming very sleepy. °SEEK IMMEDIATE MEDICAL CARE IF:  °· You have signs of dehydration: °¨ Decreased urination. °¨ Increased thirst. °¨ Dry skin and mouth. °¨ Light-headedness. °· Your blood glucose is very high (as advised by your health care provider) twice in a row. °· You faint. °· You have chest pain or trouble breathing. °· You have a sudden, severe headache. °· You have sudden weakness in one arm or one leg. °· You have sudden trouble speaking or swallowing. °· You   have vomiting or diarrhea that is getting worse after 3 hours.  You have abdominal pain. MAKE SURE YOU:   Understand these instructions.  Will watch your condition.  Will get help right away if you are not doing well or get worse. Document Released: 11/18/2000 Document Revised: 11/26/2013 Document Reviewed: 05/27/2009 [facility] Patient Information 2015 [place]. This information is not intended to replace advice given to you by your health care provider. Make sure you discuss any questions you have with your health care provider.  Myocardial Infarction A myocardial infarction (MI) is damage to the heart that is not reversible. It is also called a heart attack. An MI usually occurs when a heart (coronary) artery becomes blocked or narrowed. This cuts off the blood supply to the heart. When one or more of the heart (coronary) arteries becomes blocked, that area of the heart begins to die. This causes pain felt during an MI.  If you think you might be having an MI, call your local emergency services immediately (911 in [place] It is recommended that you chew and swallow 3 non-enteric coated baby aspirin if you do not have an aspirin allergy. Do not drive yourself to the hospital or wait to see if your symptoms go away. The sooner MI is treated, the greater the amount of heart muscle saved. Time is muscle. It can save your life. CAUSES  An MI can occur from:  A gradual buildup of a fatty substance called plaque. When plaque builds up in the arteries, this condition is called atherosclerosis. This buildup can block or reduce the blood supply to the heart artery(s).  A sudden plaque rupture within a heart artery that causes a blood clot (thrombus). A blood clot can block the heart artery which does not allow blood flow to the heart.  A severe tightening (spasm) of the heart artery. This is a less common cause of a heart attack. When a heart artery spasms, it cuts off blood flow through the artery. Spasms can occur in heart arteries that do not have atherosclerosis. RISK FACTORS People at risk for an MI usually have one or more risk factors, such as:  High blood pressure.  High cholesterol.  Smoking.  Gender. Men have a higher heart attack risk.  Overweight/obesity.  Age.  Family history.  Lack of exercise.  Diabetes.  Stress.  Excessive  alcohol use.  Street drug use (cocaine and methamphetamines). SYMPTOMS  MI symptoms can vary, such as:  In both men and women, MI symptoms can include the following:  Chest pain. The chest pain may feel like a crushing, squeezing, or "pressure" type feeling. MI pain can be "referred," meaning pain can be caused in one part of the body but felt in another part of the body. Referred MI pain may occur in the left arm, neck, or jaw. Pain may even be felt in the right arm.  Shortness of breath (dyspnea).  Heartburn or indigestion with or without vomiting, shortness of breath, or sweating (diaphoresis).  Sudden, cold sweats.  Sudden lightheadedness.  Upper back pain.  Women can have unique MI symptoms, such as:  Unexplained feelings of nervousness or anxiety.  Discomfort between the shoulder blades (scapula) or upper back.  Tingling in the hands and arms.  In elderly people (regardless of gender), MI symptoms can be subtle, such as:  Sweating (diaphoresis).  Shortness of breath (dyspnea).  General tiredness (fatigue) or not feeling well (malaise). DIAGNOSIS  Diagnosis of an MI involves several tests  such as:  An assessment of your vital signs such as heart rhythm, blood pressure, respiratory rate, and oxygen level.  An EKG (ECG) to look at the electrical activity of your heart.  Blood tests called cardiac markers are drawn at scheduled times to measure proteins or enzymes released by the damaged heart muscle.  A chest X-ray.  An echocardiogram to evaluate heart motion and blood flow.  Coronary angiography (cardiac catheterization). This is a diagnostic procedure to look at the heart arteries. TREATMENT  Acute Intervention. For an MI, the national standard in the [place] is to have an acute intervention in under 90 minutes from the time you get to the hospital. An acute intervention is a special procedure to open up the heart arteries. It is done in a treatment room  called a "catheterization lab" (cath lab). Some hospitals do no have a cath lab. If you are having an MI and the hospital does not have a cath lab, the standard is to transport you to a hospital that has one. In the cath lab, acute intervention includes:  Angioplasty. An angioplasty involves inserting a thin, flexible tube (catheter) into an artery in either your groin or wrist. The catheter is threaded to the heart arteries. A balloon at the end of the catheter is inflated to open a narrowed or blocked heart artery. During an angioplasty procedure, a small mesh tube (stent) may be used to keep the heart artery open. Depending on your condition and health history, one of two types of stents may be placed:  Drug-eluting stent (DES). A DES is coated with a medicine to prevent scar tissue from growing over the stent. With drug-eluting stents, blood thinning medicine will need to be taken for up to a year.  Bare metal stent. This type of stent has no special coating to keep tissue from growing over it. This type of stent is used if you cannot take blood thinning medicine for a prolonged time or you need surgery in the near future. After a bare metal stent is placed, blood thinning medicine will need to be taken for about a month.  If you are taking blood thinning medicine (anti-platelet therapy) after stent placement, do not stop taking it unless your caregiver says it is okay to do so. Make sure you understand how long you need to take the medicine. Surgical Intervention  If an acute intervention is not successful, surgery may be needed:  Open heart surgery (coronary artery bypass graft, CABG). CABG takes a vein (saphenous vein) from your leg. The vein is then attached to the blocked heart artery which bypasses the blockage. This then allows blood flow to the heart muscle. Additional Interventions  A "clot buster" medicine (thrombolytic) may be given. This medicine can help break up a clot in the heart  artery. This medicine may be given if a person cannot get to a cath lab right away.  Intra-aortic balloon pump (IABP). If you have suffered a very severe MI and are too unstable to go to the cath lab or to surgery, an IABP may be used. This is a temporary mechanical device used to increase blood flow to the heart and reduce the workload of the heart until you are stable enough to go to the cath lab or surgery. HOME CARE INSTRUCTIONS After an MI, you may need the following:  Medicine. Take medicine as directed by your caregiver. Medicines after an MI may:  Keep your blood from clotting easily (blood thinners).  Control your blood pressure.  Help lower your cholesterol.  Control abnormal heart rhythms.  Lifestyle changes. Under the guidance of your caregiver, lifestyle changes include:  Quitting smoking, if you smoke. Your caregiver can help you quit.  Being physically active.  Maintaining a healthy weight.  Eating a heart healthy diet. A dietitian can help you learn healthy eating options.  Managing diabetes.  Reducing stress.  Limiting alcohol intake. SEEK IMMEDIATE MEDICAL CARE IF:   You have severe chest pain, especially if the pain is crushing or pressure-like and spreads to the arms, back, neck, or jaw. This is an emergency. Do not wait to see if the pain will go away. Get medical help at once. Call your local emergency services (911 in the [place] Do not drive yourself to the hospital.  You have shortness of breath during rest, sleep, or with activity.  You have sudden sweating or clammy skin.  You feel sick to your stomach (nauseous) and throw up (vomit).  You suddenly become lightheaded or dizzy.  You feel your heart beating rapidly or you notice "skipped" beats. MAKE SURE YOU:   Understand these instructions.  Will watch your condition.  Will get help right away if you are not doing well or get worse. Document Released: 11/21/2005 Document Revised: 11/26/2013  Document Reviewed: 01/24/2014 [facility] Patient Information 2015 [place], [place]. This information is not intended to replace advice given to you by your health care provider. Make sure you discuss any questions you have with your health care provider.

## 2014-09-30 NOTE — Progress Notes (Signed)
CARDIAC REHAB PHASE I   PRE:  Rate/Rhythm: 93 SR  BP:  Supine: 160/79  Sitting:   Standing:    SaO2: 100%RA  MODE:  Ambulation: 1000 ft   POST:  Rate/Rhythm: 97 SR  BP:  Supine:   Sitting: 148/78  Standing:    SaO2:  0850-1000 Pt walked 1000 ft without CP. Tolerated well. MI education completed with pt and wife who voiced understanding. Discussed CRP 2 and permission given to refer to [place] program. Gave brilinta booklet and stressed importance of taking. Gave diabetic and heart healthy diets. Gave smoking cessation handout and encouraged not dipping. Pt using nicotine patches at this time.   Luetta Nuttingharlene Elis Rawlinson, RN BSN  09/30/2014 9:56 AM

## 2014-09-30 NOTE — Progress Notes (Signed)
Pt discharged before I was able to see him today (he was in the bathroom when I went to see him this am). Chart reviewed and follow-up arranged. thx  Tonny BollmanMichael Celese Banner 09/30/2014 12:29 PM

## 2014-10-02 LAB — CULTURE, BLOOD (ROUTINE X 2)
CULTURE: NO GROWTH
CULTURE: NO GROWTH

## 2014-10-07 ENCOUNTER — Encounter: Payer: Self-pay | Admitting: *Deleted

## 2014-10-08 ENCOUNTER — Encounter: Payer: Self-pay | Admitting: Physician Assistant

## 2014-10-08 ENCOUNTER — Ambulatory Visit (INDEPENDENT_AMBULATORY_CARE_PROVIDER_SITE_OTHER): Payer: 59 | Admitting: Physician Assistant

## 2014-10-08 VITALS — BP 112/80 | HR 55 | Ht 73.0 in | Wt 218.0 lb

## 2014-10-08 DIAGNOSIS — E785 Hyperlipidemia, unspecified: Secondary | ICD-10-CM

## 2014-10-08 DIAGNOSIS — I248 Other forms of acute ischemic heart disease: Secondary | ICD-10-CM

## 2014-10-08 DIAGNOSIS — I2510 Atherosclerotic heart disease of native coronary artery without angina pectoris: Secondary | ICD-10-CM | POA: Insufficient documentation

## 2014-10-08 DIAGNOSIS — E1059 Type 1 diabetes mellitus with other circulatory complications: Secondary | ICD-10-CM

## 2014-10-08 NOTE — Progress Notes (Signed)
Cardiology Office Note   Date:  10/08/2014   ID:  Ian RudeCharles G Houston, DOB 08-19-1961, MRN [id number]  PCP:  Gwen PoundsUSSO,JOHN M, MD  Cardiologist:  Dr. Donato SchultzMark Skains    History of Present Illness: Ian Houston is a 53 y.o. male with a hx of T1DM and tobacco abuse.  He was admitted 10/22-10/27 with DKA and hyperkalemia in the setting of insulin pump failure.  Initial EKG demonstrated lateral ST changes initially thought to be related to metabolic derangement.  However, he subsequently ruled in for NSTEMI (peak Tn 7.5).  He was taken for cardiac catheterization. This demonstrated severe stenosis in the mid LAD as well as high-grade stenosis in a small D1. LAD was treated with a DES. Ejection fraction remained preserved. He returns for follow-up.  He is doing well. He denies chest discomfort, shortness of breath, syncope. He denies orthopnea, PND or edema. He is tolerating all of his medications well.   Studies:  - LHC (09/29/14):  Mid LAD 90%, proximal D1 90% (small), proximal RCA 20-30%, EF 60% >> PCI:  24 mm x 3.0 Promus Premier DES to LAD.  - Echo (10/15):  Vigorous LVF, EF 65-70%, normal wall motion, mild MR   Recent Labs/Images:  09/28/2014: LDL (calc) 85 09/29/2014: ALT 48 09/30/2014: BUN 10; Creatinine 0.76; Hemoglobin 13.4; Potassium 3.3*; Sodium 143   Dg Chest Port 1 View  09/25/2014     IMPRESSION: No acute abnormality.  Chronic bronchitic changes.   Electronically Signed   By: Gordan PaymentSteve  Reid M.D.   On: 09/25/2014 20:49     Wt Readings from Last 3 Encounters:  10/08/14 218 lb (98.884 kg)  09/30/14 214 lb 11.7 oz (97.4 kg)  08/16/14 217 lb 13 oz (98.8 kg)     Past Medical History  Diagnosis Date  . Diabetes mellitus without complication   . Hypertension   . Tobacco abuse     a. chewing tobacco  . CAD (coronary artery disease)     a. s/p NSTEMI in the setting of DKA 10/15 >>> LHC (09/29/14):  Mid LAD 90%, proximal D1 90% (small), proximal RCA 20-30%, EF 60% >> PCI:  24 mm x 3.0  Promus Premier DES to LAD.  Marland Kitchen. History of echocardiogram     a.  Echo (10/15):  Vigorous LVF, EF 65-70%, normal wall motion, mild MR  . Hyperlipidemia     Current Outpatient Prescriptions  Medication Sig Dispense Refill  . acetaminophen (TYLENOL) 325 MG tablet Take 2 tablets (650 mg total) by mouth every 6 (six) hours as needed for mild pain, moderate pain, fever or headache.    . Alum & Mag Hydroxide-Simeth (MAGIC MOUTHWASH) SOLN Take 5 mLs by mouth 4 (four) times daily as needed for ST or Oral issues.  I sent Rx in electronically.  If feeling fine no reason to fill. 1000 mL 0  . aspirin 81 MG chewable tablet Chew 81 mg by mouth daily.    Marland Kitchen. atorvastatin (LIPITOR) 80 MG tablet Take 1 tablet (80 mg total) by mouth daily at 6 PM. 30 tablet 11  . HUMALOG 100 UNIT/ML injection     . Insulin Human (INSULIN PUMP) SOLN Inject into the skin every hour as needed. humalog    . Insulin Human (INSULIN PUMP) SOLN bolus as directed 4-6 times per day    . LEVEMIR 100 UNIT/ML injection   11  . metoprolol tartrate (LOPRESSOR) 12.5 mg TABS tablet Take 0.5 tablets (12.5 mg total) by mouth 2 (two) times  daily. 30 tablet 6  . nicotine (NICODERM CQ - DOSED IN MG/24 HOURS) 21 mg/24hr patch Place 1 patch (21 mg total) onto the skin daily. Use only if needed to avoid tobacco. 28 patch 0  . nitroGLYCERIN (NITROSTAT) 0.4 MG SL tablet Place 1 tablet (0.4 mg total) under the tongue every 5 (five) minutes as needed for chest pain. 25 tablet 12  . ticagrelor (BRILINTA) 90 MG TABS tablet Take 1 tablet (90 mg total) by mouth 2 (two) times daily. 60 tablet 11   No current facility-administered medications for this visit.     Allergies:   Sulfa antibiotics   Social History:  The patient  reports that he has been smoking.  His smokeless tobacco use includes Chew. He reports that he drinks alcohol.   Family History:  The patient's family history is not on file.   ROS:  Please see the history of present illness.       All  other systems reviewed and negative.    PHYSICAL EXAM: VS:  BP 112/80 mmHg  Pulse 55  Ht 6\' 1"  (1.854 m)  Wt 218 lb (98.884 kg)  BMI 28.77 kg/m2 Well nourished, well developed, in no acute distress HEENT: normal Neck:  no JVD Cardiac:  normal S1, S2;  RRR; no murmur Lungs:   clear to auscultation bilaterally, no wheezing, rhonchi or rales Abd: soft, nontender, no hepatomegaly Ext:  right wrist without hematoma or mass,  No LE edema Skin: warm and dry Neuro:  CNs 2-12 intact, no focal abnormalities noted  EKG:  Sinus bradycardia, HR 55, normal axis, no ST changes      ASSESSMENT AND PLAN:  1.  Coronary artery disease:  Doing well after recent non-STEMI in the setting of DKA treated with a DES to the LAD.   -  We discussed the importance of dual antiplatelet therapy.   -  Continue aspirin, Brilinta, statin, beta blocker.   -  Refer to cardiac rehabilitation. 2.  Hyperlipidemia:  Managed by primary care. Goal LDL less than 70. 3.  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  Disposition:   FU with Dr. Donato SchultzMark Skains 6-8 weeks.    Signed, Brynda RimScott Shelbie Franken, PA-C, MHS 10/08/2014 2:45 PM    [facility] [address], [place], [place]  [address] Phone: [phone]; Fax: [phone]

## 2014-10-08 NOTE — Patient Instructions (Signed)
You have been referred to CARDIAC REHAB AT [facility]  Your physician recommends that you schedule a follow-up appointment in: 6-8 WEEKS WITH DR. Anne FuSKAINS  OK TO RETURN TO WORK AFTER 10/14/14 1. FIRST WEEK WITH 1/2 DAYS 2. AFTER FIRST WEEK THEN INCREASE TO FULL DAYS 3. NO LIFTING > 20 LB'S X 6 WEEKS 4. NO LIFTING > 40 LB'S X 3 MONTHS  Your physician recommends that you continue on your current medications as directed. Please refer to the Current Medication list given to you today.

## 2014-10-16 ENCOUNTER — Telehealth: Payer: Self-pay | Admitting: Physician Assistant

## 2014-10-16 NOTE — Telephone Encounter (Signed)
New Message        Pt calling stating that he was seen 10/08/14 by Tereso NewcomerScott Weaver and is needing a note for work stating he may return to full duty. Pt is requesting note be faxed to Fax [phone].

## 2014-10-17 ENCOUNTER — Encounter: Payer: Self-pay | Admitting: *Deleted

## 2014-10-17 NOTE — Telephone Encounter (Signed)
ptcb about work note. Bing NeighborsScott W. PA s/w pt, stated from a cardiac standpoint ok to return work w/o restrictions on 10/19/14. Pt was advised per Bing NeighborsScott W. PA, pt advised to follow instructions per Dr. Jeral FruitBotero for his neck problems. Letter at front desk today.

## 2014-10-17 NOTE — Telephone Encounter (Signed)
Follow up          Pt calling back to receive work note to return back to work full duty on Sunday 10/19/2014

## 2014-10-17 NOTE — Progress Notes (Signed)
We received a call from Dr. Jeral FruitBotero earlier this week. The patient has cervical disc disease and needs surgery. The patient is not really a candidate for elective surgery.  He needs dual antiplatelet therapy for 12 months after his recent PCI in the setting of ACS. I reviewed this with Dr. Donato SchultzMark Skains who also spoke with Dr. Jeral FruitBotero. Tereso NewcomerScott Gaurav Baldree, PA-C   10/17/2014 2:20 PM

## 2014-10-23 ENCOUNTER — Encounter (HOSPITAL_COMMUNITY)
Admission: RE | Admit: 2014-10-23 | Discharge: 2014-10-23 | Disposition: A | Discharge status: Home / Self Care | Payer: 59 | Source: Ambulatory Visit | Attending: Cardiology | Admitting: Cardiology

## 2014-10-23 DIAGNOSIS — I252 Old myocardial infarction: Secondary | ICD-10-CM | POA: Insufficient documentation

## 2014-10-23 DIAGNOSIS — I10 Essential (primary) hypertension: Secondary | ICD-10-CM | POA: Insufficient documentation

## 2014-10-23 DIAGNOSIS — I2510 Atherosclerotic heart disease of native coronary artery without angina pectoris: Secondary | ICD-10-CM | POA: Insufficient documentation

## 2014-10-23 DIAGNOSIS — E109 Type 1 diabetes mellitus without complications: Secondary | ICD-10-CM | POA: Insufficient documentation

## 2014-10-23 DIAGNOSIS — Z955 Presence of coronary angioplasty implant and graft: Secondary | ICD-10-CM | POA: Insufficient documentation

## 2014-10-23 DIAGNOSIS — Z5189 Encounter for other specified aftercare: Secondary | ICD-10-CM | POA: Insufficient documentation

## 2014-10-23 NOTE — Progress Notes (Signed)
Cardiac Rehab Medication Review by a Pharmacist  Does the patient  feel that his/her medications are working for him/her?  yes  Has the patient been experiencing any side effects to the medications prescribed?  yes  Does the patient measure his/her own blood pressure or blood glucose at home?  yes   Blood sugars QID  Does the patient have any problems obtaining medications due to transportation or finances?   no  Understanding of regimen: good Understanding of indications: good Potential of compliance: excellent  Pharmacist comments: Ian Houston is a pleasant 53 year old male who presents to the clinic with a list of medications.  He is currently on an insulin pump which had malfunctioned in the past causing him to go into DKA.  He has a new insulin pump.  He checks is blood sugars regularly but does not check his blood pressure.  He appears compliant with his medications and is aware of why he is on his medications.  His wife helps him maintain his medication regimen.  [name], Pharm. D. Clinical Pharmacy Resident Pager: [phone] Ph: [phone] 10/23/2014 8:20 AM    10/23/2014 8:13 AM

## 2014-10-27 ENCOUNTER — Encounter (HOSPITAL_COMMUNITY)
Admission: RE | Admit: 2014-10-27 | Discharge: 2014-10-27 | Disposition: A | Discharge status: Home / Self Care | Payer: 59 | Source: Ambulatory Visit | Attending: Cardiology | Admitting: Cardiology

## 2014-10-27 ENCOUNTER — Ambulatory Visit: Payer: 59 | Admitting: Physician Assistant

## 2014-10-27 DIAGNOSIS — I2510 Atherosclerotic heart disease of native coronary artery without angina pectoris: Secondary | ICD-10-CM | POA: Diagnosis not present

## 2014-10-27 DIAGNOSIS — Z5189 Encounter for other specified aftercare: Secondary | ICD-10-CM | POA: Diagnosis not present

## 2014-10-27 DIAGNOSIS — I10 Essential (primary) hypertension: Secondary | ICD-10-CM | POA: Diagnosis not present

## 2014-10-27 DIAGNOSIS — Z955 Presence of coronary angioplasty implant and graft: Secondary | ICD-10-CM | POA: Diagnosis not present

## 2014-10-27 DIAGNOSIS — I252 Old myocardial infarction: Secondary | ICD-10-CM | POA: Diagnosis not present

## 2014-10-27 DIAGNOSIS — E109 Type 1 diabetes mellitus without complications: Secondary | ICD-10-CM | POA: Diagnosis not present

## 2014-10-27 NOTE — Progress Notes (Signed)
Ian Houston reported here for his first day of exercise.  CBG 85. Patient given lemonade. Repeat CBG 121. Ian Houston will not exercise today and plans to return to exercise on Wednesday. Telemetry rhythm Sinus.

## 2014-10-28 LAB — GLUCOSE, CAPILLARY
GLUCOSE-CAPILLARY: 121 mg/dL — AB (ref 70–99)
GLUCOSE-CAPILLARY: 81 mg/dL (ref 70–99)

## 2014-10-29 ENCOUNTER — Encounter (HOSPITAL_COMMUNITY): Payer: 59

## 2014-11-03 ENCOUNTER — Encounter (HOSPITAL_COMMUNITY): Payer: 59

## 2014-11-05 ENCOUNTER — Encounter (HOSPITAL_COMMUNITY): Payer: 59

## 2014-11-06 ENCOUNTER — Telehealth (HOSPITAL_COMMUNITY): Payer: Self-pay | Admitting: *Deleted

## 2014-11-07 ENCOUNTER — Encounter (HOSPITAL_COMMUNITY): Payer: 59

## 2014-11-10 ENCOUNTER — Encounter (HOSPITAL_COMMUNITY): Payer: 59

## 2014-11-12 ENCOUNTER — Encounter (HOSPITAL_COMMUNITY): Payer: 59

## 2014-11-13 ENCOUNTER — Encounter (HOSPITAL_COMMUNITY): Payer: Self-pay | Admitting: Interventional Cardiology

## 2014-11-14 ENCOUNTER — Encounter (HOSPITAL_COMMUNITY): Payer: 59

## 2014-11-17 ENCOUNTER — Encounter (HOSPITAL_COMMUNITY): Payer: 59

## 2014-11-19 ENCOUNTER — Encounter (HOSPITAL_COMMUNITY): Payer: 59

## 2014-11-21 ENCOUNTER — Encounter (HOSPITAL_COMMUNITY): Payer: 59

## 2014-11-24 ENCOUNTER — Encounter (HOSPITAL_COMMUNITY): Payer: 59

## 2014-11-26 ENCOUNTER — Encounter (HOSPITAL_COMMUNITY): Payer: 59

## 2014-12-01 ENCOUNTER — Encounter (HOSPITAL_COMMUNITY): Payer: 59

## 2014-12-01 ENCOUNTER — Encounter: Payer: Self-pay | Admitting: Cardiology

## 2014-12-01 ENCOUNTER — Ambulatory Visit (INDEPENDENT_AMBULATORY_CARE_PROVIDER_SITE_OTHER): Payer: 59 | Admitting: Cardiology

## 2014-12-01 VITALS — BP 120/71 | HR 76 | Ht 73.0 in | Wt 223.0 lb

## 2014-12-01 DIAGNOSIS — I214 Non-ST elevation (NSTEMI) myocardial infarction: Secondary | ICD-10-CM

## 2014-12-01 DIAGNOSIS — E785 Hyperlipidemia, unspecified: Secondary | ICD-10-CM

## 2014-12-01 DIAGNOSIS — I2510 Atherosclerotic heart disease of native coronary artery without angina pectoris: Secondary | ICD-10-CM

## 2014-12-01 NOTE — Progress Notes (Signed)
Cardiology Office Note   Date:  12/01/2014   ID:  Ian RudeCharles G Houston, DOB October 13, 1961, MRN [id number]  PCP:  Gwen PoundsUSSO,JOHN M, MD  Cardiologist:  Dr. Donato SchultzMark Skains    History of Present Illness: Ian Houston is a 53 y.o. male with a hx of T1DM and tobacco abuse.  He was admitted 10/22-10/27 with DKA and hyperkalemia in the setting of insulin pump failure.  Initial EKG demonstrated lateral ST changes initially thought to be related to metabolic derangement.  However, he subsequently ruled in for NSTEMI (peak Tn 7.5).  He was taken for cardiac catheterization. This demonstrated severe stenosis in the mid LAD as well as high-grade stenosis in a small D1. LAD was treated with a DES. Ejection fraction remained preserved. He returns for follow-up.  He is doing well. He denies chest discomfort, shortness of breath, syncope. He denies orthopnea, PND or edema. He is tolerating all of his medications well.  Cervical spine disease discussed with Dr. Jeral FruitBotero. See below.   Studies:  - LHC (09/29/14):  Mid LAD 90%, proximal D1 90% (small), proximal RCA 20-30%, EF 60% >> PCI:  24 mm x 3.0 Promus Premier DES to LAD.  - Echo (10/15):  Vigorous LVF, EF 65-70%, normal wall motion, mild MR   Recent Labs/Images:  09/28/2014: LDL (calc) 85 09/29/2014: ALT 48 09/30/2014: BUN 10; Creatinine 0.76; Hemoglobin 13.4; Potassium 3.3*; Sodium 143   Dg Chest Port 1 View  09/25/2014     IMPRESSION: No acute abnormality.  Chronic bronchitic changes.   Electronically Signed   By: Gordan PaymentSteve  Reid M.D.   On: 09/25/2014 20:49     Wt Readings from Last 3 Encounters:  12/01/14 223 lb (101.152 kg)  10/23/14 219 lb 9.3 oz (99.6 kg)  10/08/14 218 lb (98.884 kg)     Past Medical History  Diagnosis Date  . Diabetes mellitus without complication   . Hypertension   . Tobacco abuse     a. chewing tobacco  . CAD (coronary artery disease)     a. s/p NSTEMI in the setting of DKA 10/15 >>> LHC (09/29/14):  Mid LAD 90%, proximal D1 90%  (small), proximal RCA 20-30%, EF 60% >> PCI:  24 mm x 3.0 Promus Premier DES to LAD.  Marland Kitchen. History of echocardiogram     a.  Echo (10/15):  Vigorous LVF, EF 65-70%, normal wall motion, mild MR  . Hyperlipidemia     Current Outpatient Prescriptions  Medication Sig Dispense Refill  . aspirin 81 MG chewable tablet Chew 81 mg by mouth daily.    Marland Kitchen. atorvastatin (LIPITOR) 80 MG tablet Take 1 tablet (80 mg total) by mouth daily at 6 PM. 30 tablet 11  . gabapentin (NEURONTIN) 300 MG capsule Take 300 mg by mouth 3 (three) times daily.  0  . HUMALOG 100 UNIT/ML injection     . Insulin Human (INSULIN PUMP) SOLN Inject into the skin every hour as needed. humalog    . Insulin Human (INSULIN PUMP) SOLN bolus as directed 4-6 times per day    . metoprolol tartrate (LOPRESSOR) 25 MG tablet Take 12.5 mg by mouth 2 (two) times daily.    . nitroGLYCERIN (NITROSTAT) 0.4 MG SL tablet Place 1 tablet (0.4 mg total) under the tongue every 5 (five) minutes as needed for chest pain. 25 tablet 12  . ticagrelor (BRILINTA) 90 MG TABS tablet Take 1 tablet (90 mg total) by mouth 2 (two) times daily. 60 tablet 11  . acetaminophen (TYLENOL) 325  MG tablet Take 2 tablets (650 mg total) by mouth every 6 (six) hours as needed for mild pain, moderate pain, fever or headache. (Patient not taking: Reported on 12/01/2014)    . LEVEMIR 100 UNIT/ML injection Prn basis  11   No current facility-administered medications for this visit.     Allergies:   Sulfa antibiotics   Social History:  The patient  reports that he has been smoking.  His smokeless tobacco use includes Chew. He reports that he drinks alcohol.   Family History:  The patient's family history is not on file.   ROS:  Please see the history of present illness.       All other systems reviewed and negative.    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  HEENT: normal Neck:  no JVD Cardiac:  normal S1, S2;  RRR; no murmur Lungs:   clear to auscultation bilaterally, no wheezing, rhonchi or rales Abd: soft, nontender, no hepatomegaly Ext:   No LE edema Skin: warm and dry Neuro:  CNs 2-12 intact, no focal abnormalities noted  EKG:  Sinus bradycardia, HR 55, normal axis, no ST changes    Echocardiogram: 09/26/14-EF 65-70%  ASSESSMENT AND PLAN:  1.  Coronary artery disease:  Doing well after non-STEMI in the setting of DKA treated with a DES to the LAD 09/29/14.   -  We discussed the importance of dual antiplatelet therapy. 1 year.   -  Continue aspirin, Brilinta, statin, beta blocker.   -  cardiac rehabilitation- went for original visit however because of his firearm could not proceed. He is ambulating well doing mild improvement daily activity. 2.  Hyperlipidemia:  Managed by Dr. Timothy Lassousso. High-dose statin. Goal LDL less than 70. 3.  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 Timothy Lassousso. 4. Cervical spine disc injury-Dr. Jeral FruitBotero. I discussed with him on the telephone. We will need to try her best to wait until one year post DES placement before coming off of dual antiplatelet therapy per current cardiology guidelines.  Disposition:   FU in 6 months.    Mathews RobinsonsSigned SKAINS, MARK, MD 12/01/2014 5:00 PM    [facility] [address], [place], [place]  [address] Phone: [phone]; Fax: [phone]

## 2014-12-01 NOTE — Patient Instructions (Signed)
The current medical regimen is effective;  continue present plan and medications.  Follow up in 6 months with Dr. Skains.  You will receive a letter in the mail 2 months before you are due.  Please call us when you receive this letter to schedule your follow up appointment.  

## 2014-12-03 ENCOUNTER — Encounter (HOSPITAL_COMMUNITY): Payer: 59

## 2014-12-08 ENCOUNTER — Encounter (HOSPITAL_COMMUNITY): Payer: 59

## 2014-12-10 ENCOUNTER — Encounter (HOSPITAL_COMMUNITY): Payer: 59

## 2014-12-12 ENCOUNTER — Encounter (HOSPITAL_COMMUNITY): Payer: 59

## 2014-12-15 ENCOUNTER — Encounter (HOSPITAL_COMMUNITY): Payer: 59

## 2014-12-17 ENCOUNTER — Encounter (HOSPITAL_COMMUNITY): Payer: 59

## 2014-12-19 ENCOUNTER — Encounter (HOSPITAL_COMMUNITY): Payer: 59

## 2014-12-22 ENCOUNTER — Encounter (HOSPITAL_COMMUNITY): Payer: 59

## 2014-12-24 ENCOUNTER — Encounter (HOSPITAL_COMMUNITY): Payer: 59

## 2014-12-26 ENCOUNTER — Encounter (HOSPITAL_COMMUNITY): Payer: 59

## 2014-12-29 ENCOUNTER — Encounter (HOSPITAL_COMMUNITY): Payer: 59

## 2014-12-31 ENCOUNTER — Encounter (HOSPITAL_COMMUNITY): Payer: 59

## 2015-01-02 ENCOUNTER — Encounter (HOSPITAL_COMMUNITY): Payer: 59

## 2015-01-05 ENCOUNTER — Encounter (HOSPITAL_COMMUNITY): Payer: 59

## 2015-01-07 ENCOUNTER — Encounter (HOSPITAL_COMMUNITY): Payer: 59

## 2015-01-09 ENCOUNTER — Encounter (HOSPITAL_COMMUNITY): Payer: 59

## 2015-01-12 ENCOUNTER — Encounter (HOSPITAL_COMMUNITY): Payer: 59

## 2015-01-14 ENCOUNTER — Encounter (HOSPITAL_COMMUNITY): Payer: 59

## 2015-01-16 ENCOUNTER — Encounter (HOSPITAL_COMMUNITY): Payer: 59

## 2015-01-19 ENCOUNTER — Encounter (HOSPITAL_COMMUNITY): Payer: 59

## 2015-01-21 ENCOUNTER — Encounter (HOSPITAL_COMMUNITY): Payer: 59

## 2015-01-23 ENCOUNTER — Encounter (HOSPITAL_COMMUNITY): Payer: 59

## 2015-01-26 ENCOUNTER — Encounter (HOSPITAL_COMMUNITY): Payer: 59

## 2015-01-28 ENCOUNTER — Encounter (HOSPITAL_COMMUNITY): Payer: 59

## 2015-01-30 ENCOUNTER — Encounter (HOSPITAL_COMMUNITY): Payer: 59

## 2015-02-02 ENCOUNTER — Encounter (HOSPITAL_COMMUNITY): Payer: 59

## 2015-06-15 ENCOUNTER — Encounter: Payer: Self-pay | Admitting: *Deleted

## 2015-06-18 ENCOUNTER — Ambulatory Visit (INDEPENDENT_AMBULATORY_CARE_PROVIDER_SITE_OTHER): Payer: 59 | Admitting: Cardiology

## 2015-06-18 ENCOUNTER — Encounter: Payer: Self-pay | Admitting: Cardiology

## 2015-06-18 VITALS — BP 130/82 | HR 77 | Ht 72.0 in | Wt 240.0 lb

## 2015-06-18 DIAGNOSIS — E785 Hyperlipidemia, unspecified: Secondary | ICD-10-CM | POA: Diagnosis not present

## 2015-06-18 DIAGNOSIS — E1059 Type 1 diabetes mellitus with other circulatory complications: Secondary | ICD-10-CM

## 2015-06-18 DIAGNOSIS — I2510 Atherosclerotic heart disease of native coronary artery without angina pectoris: Secondary | ICD-10-CM | POA: Diagnosis not present

## 2015-06-18 MED ORDER — CLOPIDOGREL BISULFATE 75 MG PO TABS: 75.0000 mg | ORAL_TABLET | Freq: Every day | ORAL | Status: DC

## 2015-06-18 NOTE — Progress Notes (Signed)
Cardiology Office Note   Date:  06/18/2015   ID:  Ian Houston, DOB 15-Jan-1961, MRN [id number]  PCP:  Gwen Pounds, MD  Cardiologist:  Dr. Donato Schultz    History of Present Illness: Ian Houston is a 54 y.o. male with a hx of T1DM and former smoker, no longer dipping.  He was admitted 10/22-10/27 with DKA and hyperkalemia in the setting of insulin pump failure.  Initial EKG demonstrated lateral ST changes initially thought to be related to metabolic derangement.  However, he subsequently ruled in for NSTEMI (peak Tn 7.5).  He was taken for cardiac catheterization. This demonstrated severe stenosis in the mid LAD as well as high-grade stenosis in a small D1. LAD was treated with a DES. Ejection fraction remained preserved. He returns for follow-up.  He is doing well. He denies chest discomfort, shortness of breath, syncope. He denies orthopnea, PND or edema.    he does feel occasional fleeting palpitation and during that moment feels as though he needs to take in a deep breath. He wonders if the Marden Noble is doing this.   Cervical spine disease discussed with Dr. Jeral Fruit. See below.   Studies:  - LHC (09/29/14):  Mid LAD 90%, proximal D1 90% (small), proximal RCA 20-30%, EF 60% >> PCI:  24 mm x 3.0 Promus Premier DES to LAD.  - Echo (10/15):  Vigorous LVF, EF 65-70%, normal wall motion, mild MR   Recent Labs/Images:  09/28/2014: LDL Cholesterol 85 09/29/2014: ALT 48 09/30/2014: BUN 10; Creatinine, Ser 0.76; Hemoglobin 13.4; Potassium 3.3*; Sodium 143   Dg Chest Port 1 View  09/25/2014     IMPRESSION: No acute abnormality.  Chronic bronchitic changes.   Electronically Signed   By: Ian Houston M.D.   On: 09/25/2014 20:49     Wt Readings from Last 3 Encounters:  06/18/15 240 lb (108.863 kg)  12/01/14 223 lb (101.152 kg)  10/23/14 219 lb 9.3 oz (99.6 kg)     Past Medical History  Diagnosis Date  . Diabetes mellitus without complication   . Hypertension   . Tobacco abuse    a. chewing tobacco  . CAD (coronary artery disease)     a. s/p NSTEMI in the setting of DKA 10/15 >>> LHC (09/29/14):  Mid LAD 90%, proximal D1 90% (small), proximal RCA 20-30%, EF 60% >> PCI:  24 mm x 3.0 Promus Premier DES to LAD.  Marland Kitchen History of echocardiogram     a.  Echo (10/15):  Vigorous LVF, EF 65-70%, normal wall motion, mild MR  . Hyperlipidemia     Current Outpatient Prescriptions  Medication Sig Dispense Refill  . acetaminophen (TYLENOL) 325 MG tablet Take 2 tablets (650 mg total) by mouth every 6 (six) hours as needed for mild pain, moderate pain, fever or headache.    Marland Kitchen aspirin 81 MG chewable tablet Chew 81 mg by mouth daily.    Marland Kitchen atorvastatin (LIPITOR) 80 MG tablet Take 1 tablet (80 mg total) by mouth daily at 6 PM. 30 tablet 11  . gabapentin (NEURONTIN) 300 MG capsule Take 300 mg by mouth 3 (three) times daily.  0  . HUMALOG 100 UNIT/ML injection     . Insulin Human (INSULIN PUMP) SOLN Inject into the skin every hour as needed. humalog    . Insulin Human (INSULIN PUMP) SOLN bolus as directed 4-6 times per day    . LEVEMIR 100 UNIT/ML injection Prn basis  11  . metoprolol tartrate (LOPRESSOR) 25 MG tablet  Take 12.5 mg by mouth 2 (two) times daily.    . nitroGLYCERIN (NITROSTAT) 0.4 MG SL tablet Place 1 tablet (0.4 mg total) under the tongue every 5 (five) minutes as needed for chest pain. 25 tablet 12  . ticagrelor (BRILINTA) 90 MG TABS tablet Take 1 tablet (90 mg total) by mouth 2 (two) times daily. 60 tablet 11   No current facility-administered medications for this visit.     Allergies:   Sulfa antibiotics   Social History:  The patient  reports that he quit smoking about 8 months ago. His smokeless tobacco use includes Chew. He reports that he drinks alcohol.   Family History:  The patient's family history includes Emphysema in his mother; Leukemia in his paternal grandmother.   ROS:  Please see the history of present illness.       All other systems reviewed and  negative.    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  HEENT: normal Neck:  no JVD Cardiac:  normal S1, S2;  RRR; no murmur Lungs:   clear to auscultation bilaterally, no wheezing, rhonchi or rales Abd: soft, nontender, no hepatomegaly Ext:   No LE edema Skin: warm and dry Neuro:  CNs 2-12 intact, no focal abnormalities noted  EKG:  Sinus bradycardia, HR 55, normal axis, no ST changes    Echocardiogram: 09/26/14-EF 65-70%  ASSESSMENT AND PLAN:  1.  Coronary artery disease:  Doing well after non-STEMI in the setting of DKA treated with a DES to the LAD 09/29/14.   -  We discussed the importance of dual antiplatelet therapy. 1 year.   -  Continue aspirin, we will change Brilinta to Plavix, continue statin, beta blocker.   -  cardiac rehabilitation- completed  2.  Hyperlipidemia:  Managed by Dr. Timothy Lassousso. High-dose statin. Goal LDL less than 70. Doing well. No side effects of medications.   3.  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 Timothy Lassousso.  4. Cervical spine disc injury-Dr. Jeral FruitBotero. I discussed with him on the telephone. We will need to try her best to wait until one year post DES placement before coming off of dual antiplatelet therapy per current cardiology guidelines.   5. Former tobacco user-smoked several years ago, dipped up until recently. Is was challenging for him to quit. He has succeeded.     Disposition:   FU in 4 months. at that time I'll make sure that he is suitable for cervical spine surgery.    Mathews RobinsonsSigned Taleigha Pinson, MD 06/18/2015 2:18 PM    [facility] [address], [place], [place]  [address] Phone: [phone]; Fax: [phone]

## 2015-06-18 NOTE — Patient Instructions (Signed)
Medication Instructions:  Please stop Brilinta. Start Plavix and continue all other medications as listed.  Follow-Up: Follow up in 4 months with Dr. Anne FuSkains.  You will receive a letter in the mail 2 months before you are due.  Please call us when you receive this letter to schedule your follow up appointment.  Thank you for choosing [facility]!!

## 2015-09-29 ENCOUNTER — Telehealth: Payer: Self-pay | Admitting: Cardiology

## 2015-09-29 NOTE — Telephone Encounter (Signed)
New message     What dental office are you calling from? Dr Marcha Soldersathey What is your office phone and fax number? Fax [phone] What type of procedure is the patient having performed? extraction 1. What date is procedure scheduled? Pending clearance  2. What is your question (ex. Antibiotics prior to procedure, holding medication-we need to know how long dentist wants pt to hold med)?  Pt is on aspirin and plavix--will he need to hold these meds

## 2015-09-29 NOTE — Telephone Encounter (Signed)
Will forward to Dr Skains for review and orders 

## 2015-10-01 ENCOUNTER — Telehealth: Payer: Self-pay | Admitting: Cardiology

## 2015-10-01 NOTE — Telephone Encounter (Signed)
Called and left message for pt OK to stop Plavix but continue ASA.  Requested he call back with any questions.  Information will be faxed to Dr Gabriel Rungathey's office as well.

## 2015-10-01 NOTE — Telephone Encounter (Signed)
[facility] @ [phone].

## 2015-10-01 NOTE — Telephone Encounter (Signed)
No dental antibiotics needed. He is now one year post DES placement to LAD.  I am fine with him stopping Plavix but have him continue the ASA. Thanks.  Donato SchultzSKAINS, Davidmichael Zarazua, MD

## 2015-10-06 IMAGING — CR DG CHEST 2V
2 series · 2 of 2 positions shown · non-contrast
Comparison: None.

CLINICAL DATA: Chest pain.

EXAM:
CHEST  2 VIEW

[w chest pa]
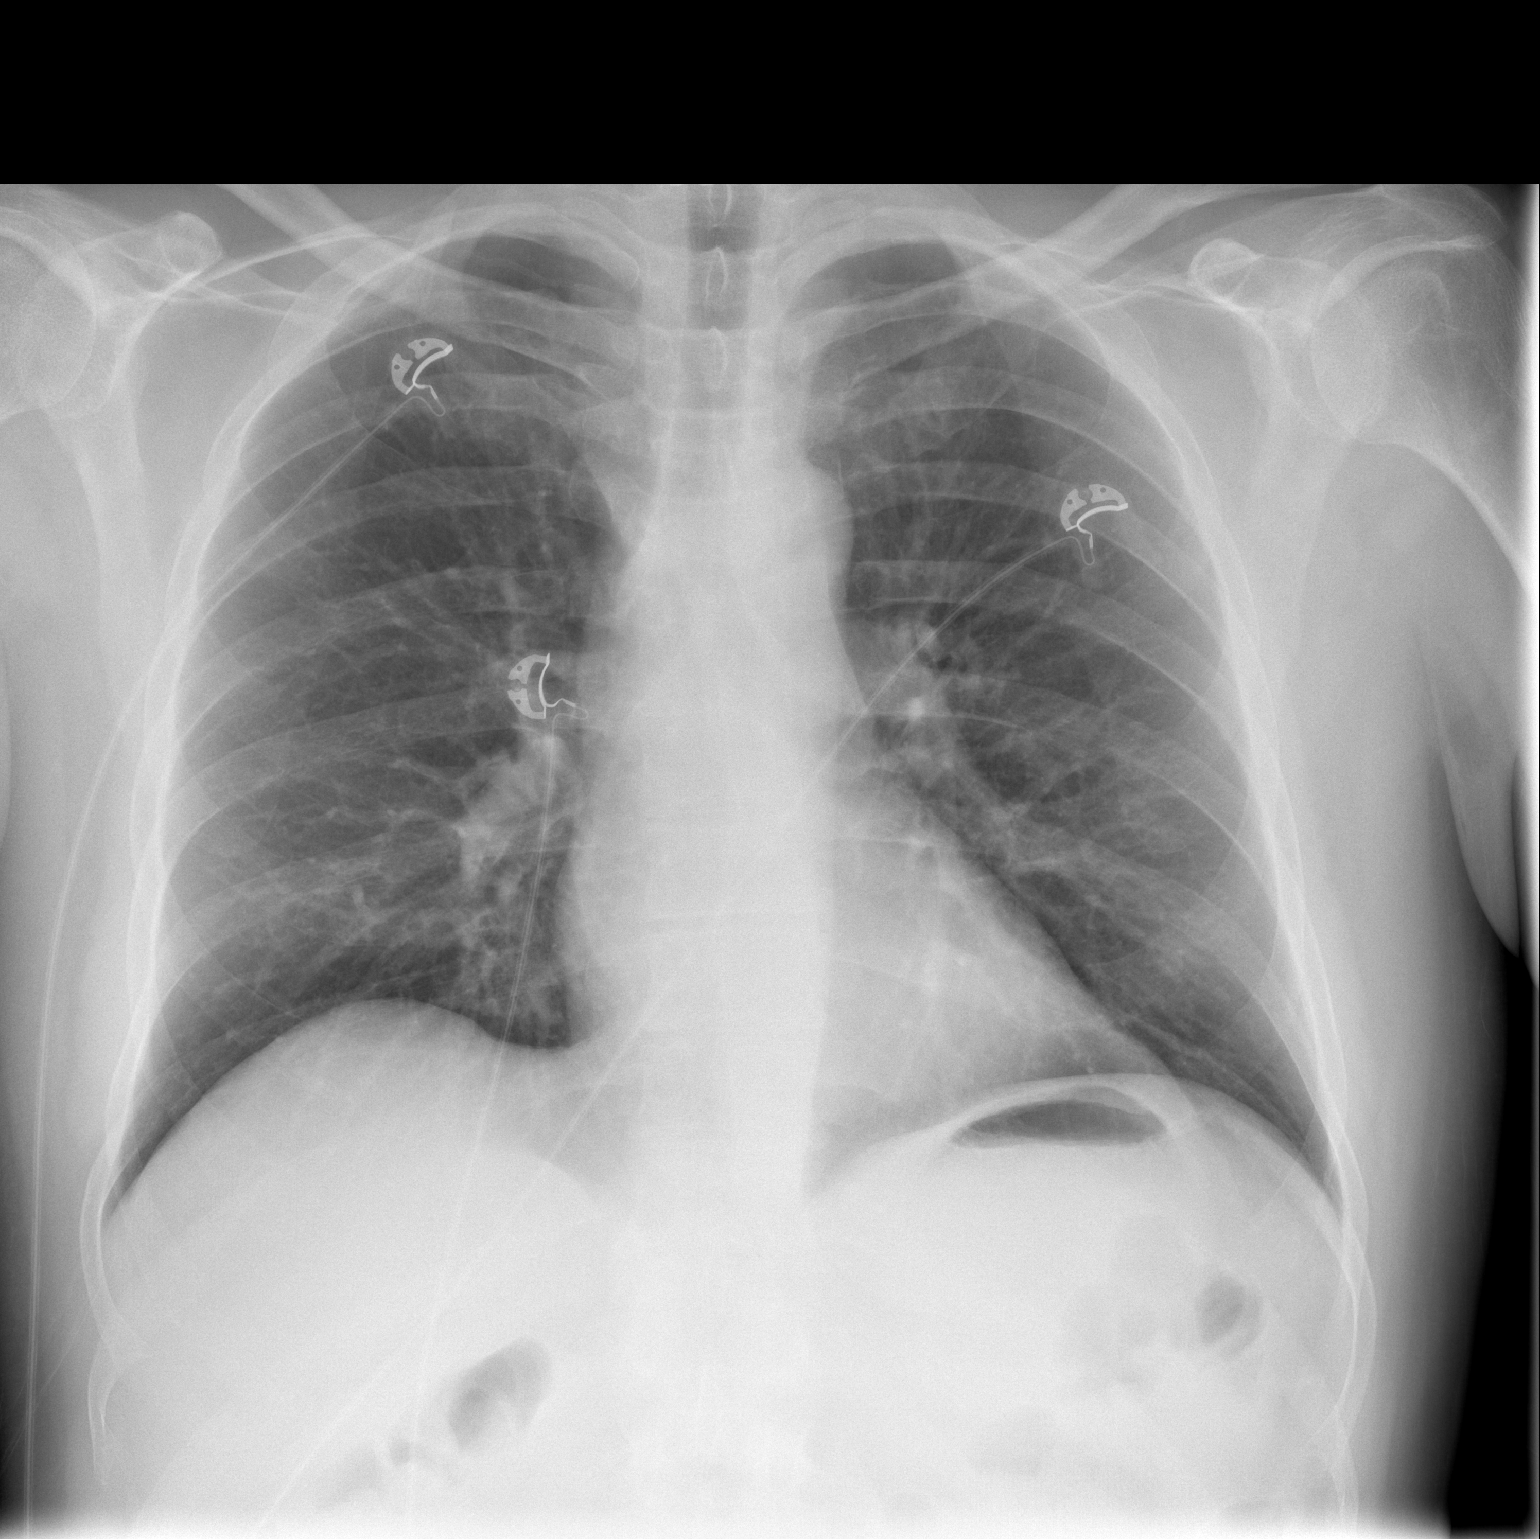

[w chest lat]
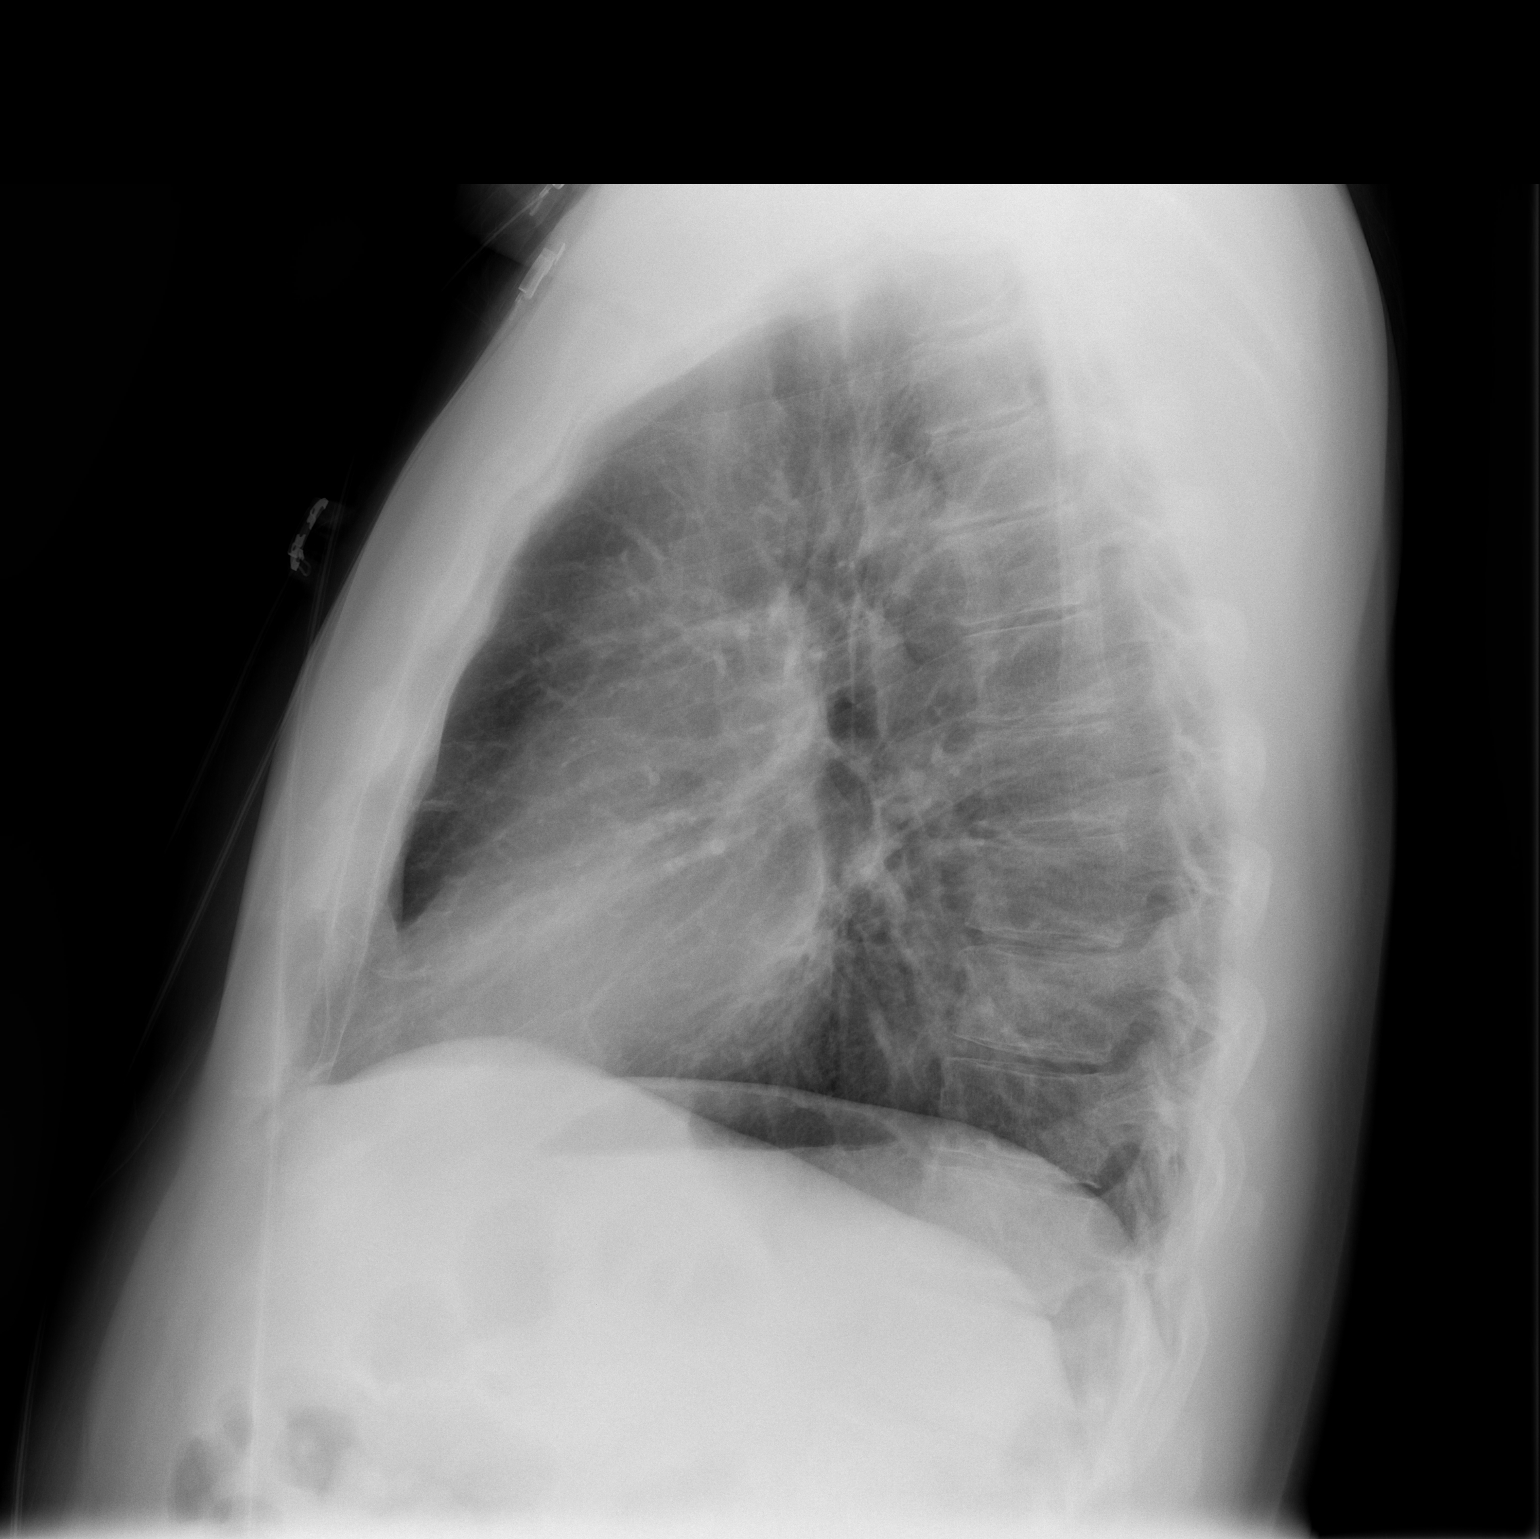

[2 of 2 positions shown; findings below may reference images not displayed]

FINDINGS: The heart size and mediastinal contours are within normal limits.
Both lungs are clear. The visualized skeletal structures are
unremarkable.
IMPRESSION: Normal chest x-ray.

## 2015-11-05 ENCOUNTER — Ambulatory Visit (INDEPENDENT_AMBULATORY_CARE_PROVIDER_SITE_OTHER): Payer: 59 | Admitting: Cardiology

## 2015-11-05 ENCOUNTER — Encounter: Payer: Self-pay | Admitting: Cardiology

## 2015-11-05 VITALS — BP 142/82 | HR 67 | Ht 73.5 in | Wt 223.8 lb

## 2015-11-05 DIAGNOSIS — I2510 Atherosclerotic heart disease of native coronary artery without angina pectoris: Secondary | ICD-10-CM | POA: Diagnosis not present

## 2015-11-05 DIAGNOSIS — E785 Hyperlipidemia, unspecified: Secondary | ICD-10-CM

## 2015-11-05 DIAGNOSIS — I10 Essential (primary) hypertension: Secondary | ICD-10-CM | POA: Diagnosis not present

## 2015-11-05 NOTE — Patient Instructions (Signed)

## 2015-11-05 NOTE — Progress Notes (Signed)
Cardiology Office Note   Date:  11/05/2015   ID:  [name], DOB [birth year], MRN [id number]  PCP:  Gwen Pounds, MD  Cardiologist:  Dr. Donato Schultz    History of Present Illness: Ian Houston is a 54 y.o. male with a hx of T1DM and former smoker, no longer dipping.  He was admitted 10/22-10/27/15 with DKA and hyperkalemia in the setting of insulin pump failure.  Initial EKG demonstrated lateral ST changes initially thought to be related to metabolic derangement.  However, he subsequently ruled in for NSTEMI (peak Tn 7.5).  He was taken for cardiac catheterization. This demonstrated severe stenosis in the mid LAD as well as high-grade stenosis in a small D1. LAD was treated with a DES. Ejection fraction remained preserved.   He was on DAPT x 1 year (stopped in Oct 2016) and remains on ASA daily.  He returns for follow-up.  He is doing well. He denies chest discomfort, shortness of breath, syncope, orthopnea, PND or edema.  He reports snoring and has known diagnosis of OSA for which he uses an oral appliance.  Studies:  - LHC (09/29/14):  Mid LAD 90%, proximal D1 90% (small), proximal RCA 20-30%, EF 60% >> PCI:  24 mm x 3.0 Promus Premier DES to LAD.  - Echo (10/15):  Vigorous LVF, EF 65-70%, normal wall motion, mild MR   Recent Labs/Images:  Wt Readings from Last 3 Encounters:  11/05/15 223 lb 12.8 oz (101.515 kg)  06/18/15 240 lb (108.863 kg)  12/01/14 223 lb (101.152 kg)     Past Medical History  Diagnosis Date  . Diabetes mellitus without complication (HCC)   . Hypertension   . Tobacco abuse     a. chewing tobacco  . CAD (coronary artery disease)     a. s/p NSTEMI in the setting of DKA 10/15 >>> LHC (09/29/14):  Mid LAD 90%, proximal D1 90% (small), proximal RCA 20-30%, EF 60% >> PCI:  24 mm x 3.0 Promus Premier DES to LAD.  Marland Kitchen History of echocardiogram     a.  Echo (10/15):  Vigorous LVF, EF 65-70%, normal wall motion, mild MR  . Hyperlipidemia     Current Outpatient  Prescriptions  Medication Sig Dispense Refill  . acetaminophen (TYLENOL) 325 MG tablet Take 2 tablets (650 mg total) by mouth every 6 (six) hours as needed for mild pain, moderate pain, fever or headache.    Marland Kitchen aspirin 81 MG chewable tablet Chew 81 mg by mouth daily.    Marland Kitchen atorvastatin (LIPITOR) 80 MG tablet Take 1 tablet (80 mg total) by mouth daily at 6 PM. 30 tablet 11  . HUMALOG 100 UNIT/ML injection     . Insulin Human (INSULIN PUMP) SOLN bolus as directed 4-6 times per day    . LEVEMIR 100 UNIT/ML injection Prn basis  11  . metoprolol tartrate (LOPRESSOR) 25 MG tablet Take 12.5 mg by mouth 2 (two) times daily.    . nitroGLYCERIN (NITROSTAT) 0.4 MG SL tablet Place 1 tablet (0.4 mg total) under the tongue every 5 (five) minutes as needed for chest pain. 25 tablet 12   No current facility-administered medications for this visit.     Allergies:   Sulfa antibiotics   Social History:  The patient  reports that he quit smoking about 13 months ago. His smokeless tobacco use includes Chew. He reports that he drinks alcohol.   Family History:  The patient's family history includes Emphysema in his mother; Leukemia in  his paternal grandmother.   ROS:  Please see the history of present illness. + snoring.  All other systems reviewed and negative.   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  HEENT: normal Neck:  no JVD Cardiac:  normal S1, S2;  RRR; no murmur Lungs:   clear to auscultation bilaterally, no wheezing, rhonchi or rales Abd: soft, nontender, no hepatomegaly Ext:   No LE edema Skin: warm and dry Neuro: no focal abnormalities noted  EKG:  Today 11/05/15 - NSR, HR 67, normal axis, no ST changes    Echocardiogram: 09/26/14 - EF 65-70%  ASSESSMENT AND PLAN:  1.  Coronary artery disease:  Doing well after  non-STEMI in the setting of DKA treated with a DES to the LAD 09/29/14.  Was on DAPT x 1 year (stopped in Oct 2016).   No cardiac symptoms.   -  Continue with ASA, high dose statin, BB  2.  Hyperlipidemia:  Managed by Dr. Timothy Lassousso. Goal LDL less than 70. Doing well, no ADRs.  On high-dose statin.   3.  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  Recent A1c 8.6.  Aim for good control.  Follow-up by Dr. Timothy Lassousso.  4. Cervical spine disc injury- followed by Dr. Jeral FruitBotero. Ok to proceed with any planned surgeries if needed.   5. Former tobacco user-smoked several years ago.  Currently reports occasional cigar use.  Counseled to decrease cigar use.    Disposition:   FU in 1 year.   Mathews RobinsonsSigned Yajahira Tison, MD 11/05/2015 4:26 PM    [facility] [address], [place], [place]  [address] Phone: [phone](336) [phone]; Fax: [phone]

## 2015-11-16 IMAGING — DX DG CHEST 1V PORT
1 series · 1 of 1 positions shown · non-contrast
Comparison: 08/15/2014.

CLINICAL DATA: Chest pain today.  Smoker.

EXAM:
PORTABLE CHEST - 1 VIEW

[chest ap]
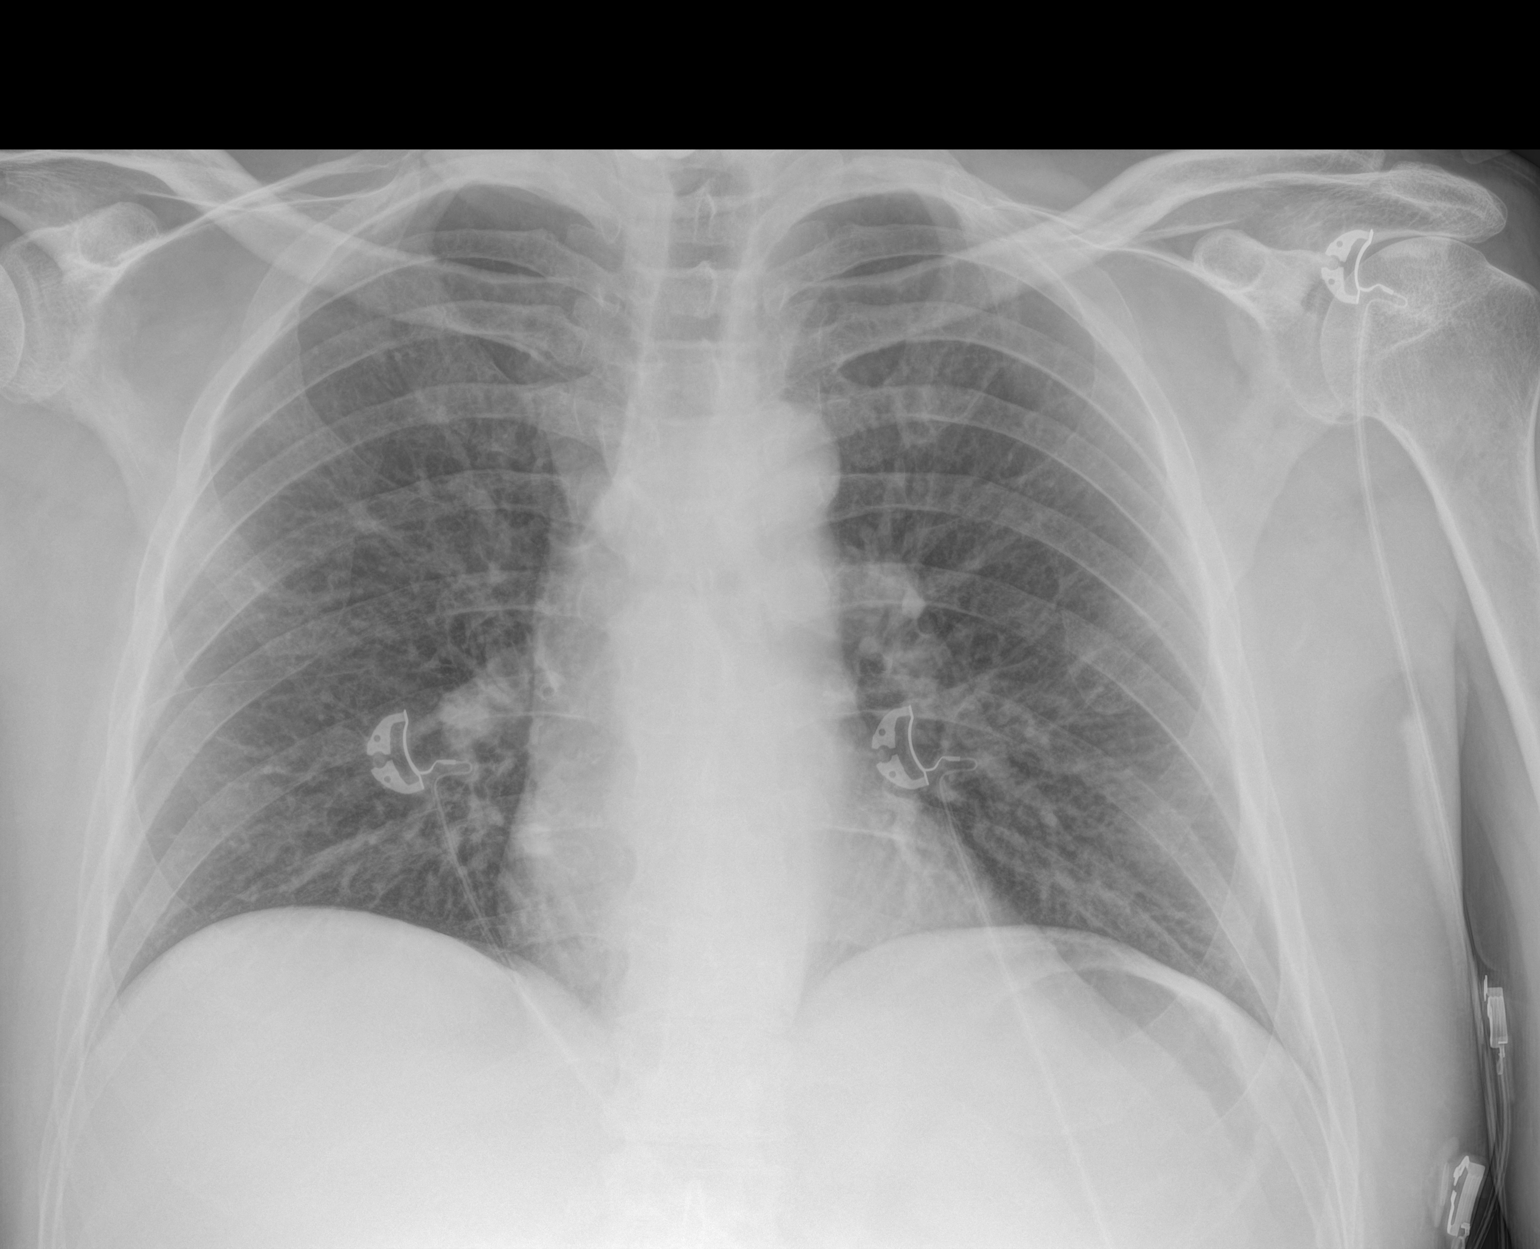

[1 of 1 positions shown; findings below may reference images not displayed]

FINDINGS: Normal sized heart. Clear lungs. Diffuse peribronchial thickening
and accentuation of the interstitial markings. Cervical spine
fixation hardware.
IMPRESSION: No acute abnormality.  Chronic bronchitic changes.

## 2016-04-20 ENCOUNTER — Other Ambulatory Visit: Payer: Self-pay | Admitting: Neurosurgery

## 2016-04-26 ENCOUNTER — Encounter (HOSPITAL_COMMUNITY)
Admission: RE | Admit: 2016-04-26 | Discharge: 2016-04-26 | Disposition: A | Discharge status: Home / Self Care | Payer: 59 | Source: Ambulatory Visit | Attending: Neurosurgery | Admitting: Neurosurgery

## 2016-04-26 ENCOUNTER — Other Ambulatory Visit (HOSPITAL_COMMUNITY): Payer: Self-pay | Admitting: *Deleted

## 2016-04-26 ENCOUNTER — Encounter (HOSPITAL_COMMUNITY): Payer: Self-pay

## 2016-04-26 HISTORY — DX: Other complications of anesthesia, initial encounter: T88.59XA

## 2016-04-26 HISTORY — DX: Gastro-esophageal reflux disease without esophagitis: K21.9

## 2016-04-26 HISTORY — DX: Adverse effect of unspecified anesthetic, initial encounter: T41.45XA

## 2016-04-26 LAB — BASIC METABOLIC PANEL
ANION GAP: 9 (ref 5–15)
BUN: 12 mg/dL (ref 6–20)
CO2: 22 mmol/L (ref 22–32)
Calcium: 9.3 mg/dL (ref 8.9–10.3)
Chloride: 104 mmol/L (ref 101–111)
Creatinine, Ser: 0.86 mg/dL (ref 0.61–1.24)
GFR calc Af Amer: >60 mL/min (ref >60)
GLUCOSE: 197 mg/dL — AB (ref 65–99)
POTASSIUM: 4.3 mmol/L (ref 3.5–5.1)
Sodium: 135 mmol/L (ref 135–145)

## 2016-04-26 LAB — CBC
HEMATOCRIT: 45.9 % (ref 39.0–52.0)
HEMOGLOBIN: 16.1 g/dL (ref 13.0–17.0)
MCH: 29.8 pg (ref 26.0–34.0)
MCHC: 35.1 g/dL (ref 30.0–36.0)
MCV: 84.8 fL (ref 78.0–100.0)
Platelets: 270 10*3/uL (ref 150–400)
RBC: 5.41 MIL/uL (ref 4.22–5.81)
RDW: 12.3 % (ref 11.5–15.5)
WBC: 8.9 10*3/uL (ref 4.0–10.5)

## 2016-04-26 LAB — SURGICAL PCR SCREEN
MRSA, PCR: NEGATIVE
STAPHYLOCOCCUS AUREUS: POSITIVE — AB

## 2016-04-26 NOTE — Progress Notes (Signed)
I called a prescription for Mupiro[facility] ointment to [facility] [place] 

## 2016-04-26 NOTE — Pre-Procedure Instructions (Signed)
    Ian RudeCharles G Houston  04/26/2016      [id number] [place], [place] - [address] [place] [address] Phone: [phone] Fax: [phone]    Your procedure is scheduled on Friday May 26th  [facility] at 9:15 am  Call this number if you have problems the morning of surgery: [phone]  If problems prior to surgery call [phone] M-F between 8 and 4   Remember:  Do not eat food or drink liquids after midnight.  Take these medicines the morning of surgery with A SIP OF WATER: Metoprolol  For patients with Insulin Pumps: o Contact your diabetes doctor for specific instructions before surgery. o Decrease basal insulin rates by 20% at midnight the night before surgery. o Note that if your surgery is planned to be longer than 2 hours, your insulin pump will be removed and intravenous (IV) insulin will be started and managed by the nurses and anesthesiologist. You will be able to restart your insulin pump once you are awake and able to manage it. o Make sure to bring insulin pump supplies to the hospital with you in case your site needs to be changed.   Do not wear jewelry.  Do not wear lotions, or cologne.  You may wear deodorant.             Men may shave face and neck.  Do not bring valuables to the hospital.  [facility] is not responsible for any belongings or valuables.  Contacts, dentures or bridgework may not be worn into surgery.  Leave your suitcase in the car.  After surgery it may be brought to your room.  Special instructions:  Shower with CHG the night before and morning of surgery as instructed  Please read over the following fact sheets that you were given. MRSA Information

## 2016-04-26 NOTE — Progress Notes (Signed)
Cardiologist Dr Dione HousekeeperSkaines.  Clearance note in EPIC from 11/05/15  Pt's diabetes and primary care [facility]  He is scheduled to see him this Thursday at 0915  Pt has insulin pump. Spoke with diabetic coordinator re pt's upcoming surgery. Pt has history of pump malfunction in Oct 2015 and he was admitted with DKA.  After this he ended up with a heart cath and stent placement.   Sleep study done many years ago (uncertain as to how many but more than 5) where he used a Cpap for a period of time.  Recently had a type of sleep study done with Dr Irene LimboSandra Fuller DDS. He currently wears a "mose" device while he sleeps.  EKG 11/06/15 in EPIC Echo 09/26/14 in EPIC Heart Cath 09/29/14 in EPIC Denies stress test

## 2016-04-27 LAB — HEMOGLOBIN A1C
HEMOGLOBIN A1C: 8.3 % — AB (ref 4.8–5.6)
MEAN PLASMA GLUCOSE: 192 mg/dL

## 2016-04-27 NOTE — Progress Notes (Signed)
Anesthesia Chart Review: Patient is a 55 year old male scheduled for C4-5 ACDF on 04/29/16 by Dr. Jeral FruitBotero.  History includes DM1, DKA 09/2014 (due to insulin pump malfunction), CAD/NSTEMI (in the setting of DKA) s/p DES LAD 09/29/14, smoking, HLD, HTN, GERD, back surgery '11, nasal septum surgery  ~ '98. Reports he is difficult to wake up after anesthesia. BMI is consistent with mild obesity. Notes in Epic indicate that he uses an oral appliance for sleep apnea.    PCP is Dr. Creola CornJohn Russo with [facility]. Reportedly, patient has follow-up visit there on 04/28/16. Cardiologist is Dr. Donato SchultzMark Skains with clearance note in Epic from 11/05/15.  Meds include ASA 81mg , Lipitor, insulin pump, Lopressor, Nitro.  11/05/15 EKG: NSR.  Studies: - LHC (09/29/14): Mid LAD 90%, proximal D1 90% (small), proximal RCA 20-30%, EF 60% >> PCI: 24 mm x 3.0 Promus Premier DES to LAD. - Echo (09/26/14): Vigorous LVF, EF 65-70%, normal wall motion, mild MR  Preoperative labs noted. A1c 8.3. DM Coordinator notified of insulin pump.   If no acute changes then I anticipate that he can proceed as planned.  Ian Houston Ian Wegman, PA-C [facility] Phone [phone] 04/27/2016 1:16 PM

## 2016-04-28 MED ORDER — CEFAZOLIN SODIUM-DEXTROSE 2-4 GM/100ML-% IV SOLN
2.0000 g | INTRAVENOUS | Status: AC
  Administered 2016-04-29: 2 g via INTRAVENOUS
  Filled 2016-04-28: qty 100

## 2016-04-28 NOTE — H&P (Signed)
Ian RudeCharles G Everingham is an 55 y.o. male.   Chief Complaint: burning sensation in both hands HPI: patient who in the past had acdf whom a year ago found himself on the floor with burnong sendation in both arms. Dad ca cervical mri which showed stenosis at c4-5 with fusion above and below.he has been on blood thiner for a heart disease. Came to see me 2 weeks ago and a decision was made to proceed with surgery  Past Medical History  Diagnosis Date  . Diabetes mellitus without complication (HCC)   . Hypertension   . Tobacco abuse     a. chewing tobacco  . CAD (coronary artery disease)     a. s/p NSTEMI in the setting of DKA 10/15 >>> LHC (09/29/14):  Mid LAD 90%, proximal D1 90% (small), proximal RCA 20-30%, EF 60% >> PCI:  24 mm x 3.0 Promus Premier DES to LAD.  Marland Kitchen. History of echocardiogram     a.  Echo (10/15):  Vigorous LVF, EF 65-70%, normal wall motion, mild MR  . Hyperlipidemia   . GERD (gastroesophageal reflux disease)   . Complication of anesthesia     states difficult to wake up    Past Surgical History  Procedure Laterality Date  . Left heart catheterization with coronary angiogram N/A 09/29/2014    Procedure: LEFT HEART CATHETERIZATION WITH CORONARY ANGIOGRAM;  Surgeon: Lesleigh NoeHenry W Smith III, MD;  [facility];  Service: Cardiovascular;  Laterality: N/A;  . Back surgery  2011  . Nasal septum surgery  1998?    Family History  Problem Relation Age of Onset  . Emphysema Mother   . Leukemia Paternal Grandmother    Social History:  reports that he has been smoking Cigars.  He has quit using smokeless tobacco. His smokeless tobacco use included Chew. He reports that he drinks about 8.4 oz of alcohol per week. He reports that he does not use illicit drugs.  Allergies:  Allergies  Allergen Reactions  . Sulfa Antibiotics Other (See Comments)    Flu like symptoms    No prescriptions prior to admission    No results found for this or any previous visit (from the past 48  hour(s)). No results found.  Review of Systems  Constitutional: Negative.   HENT: Negative.   Eyes: Negative.   Respiratory: Negative.   Cardiovascular: Negative.   Gastrointestinal: Negative.   Genitourinary: Negative.   Musculoskeletal: Negative.   Skin: Negative.   Neurological: Positive for sensory change.  Endo/Heme/Allergies: Negative.   Psychiatric/Behavioral: Negative.     There were no vitals taken for this visit. Physical Exam hent, nl. Neck antrerior scar. Cv, nl. Lungs clear. Abdomen , nl. Extremities nl. Neuro exam normalexcept for some mild weakness of the deltoids.  Assessment/Plan To go ahead with acdf at c4-5. He and his wife are aware of risks and benefits  Karn CassisBOTERO,Joshawa Dubin M, MD 04/28/2016, 6:16 PM

## 2016-04-29 ENCOUNTER — Ambulatory Visit (HOSPITAL_COMMUNITY): Payer: 59 | Admitting: Certified Registered Nurse Anesthetist

## 2016-04-29 ENCOUNTER — Inpatient Hospital Stay (HOSPITAL_COMMUNITY)
Admission: AD | Admit: 2016-04-29 | Discharge: 2016-04-30 | DRG: 473 | Disposition: A | Discharge status: Home / Self Care | Payer: 59 | Source: Ambulatory Visit | Attending: Neurosurgery | Admitting: Neurosurgery

## 2016-04-29 ENCOUNTER — Encounter (HOSPITAL_COMMUNITY): Payer: Self-pay | Admitting: Surgery

## 2016-04-29 ENCOUNTER — Ambulatory Visit (HOSPITAL_COMMUNITY): Payer: 59 | Admitting: Vascular Surgery

## 2016-04-29 ENCOUNTER — Encounter (HOSPITAL_COMMUNITY)
Admission: AD | Disposition: A | Discharge status: Home / Self Care | Payer: Self-pay | Source: Ambulatory Visit | Attending: Neurosurgery

## 2016-04-29 ENCOUNTER — Ambulatory Visit (HOSPITAL_COMMUNITY): Payer: 59

## 2016-04-29 DIAGNOSIS — F17290 Nicotine dependence, other tobacco product, uncomplicated: Secondary | ICD-10-CM | POA: Diagnosis present

## 2016-04-29 DIAGNOSIS — I2510 Atherosclerotic heart disease of native coronary artery without angina pectoris: Secondary | ICD-10-CM | POA: Diagnosis present

## 2016-04-29 DIAGNOSIS — E785 Hyperlipidemia, unspecified: Secondary | ICD-10-CM | POA: Diagnosis present

## 2016-04-29 DIAGNOSIS — Z419 Encounter for procedure for purposes other than remedying health state, unspecified: Secondary | ICD-10-CM

## 2016-04-29 DIAGNOSIS — I252 Old myocardial infarction: Secondary | ICD-10-CM | POA: Diagnosis not present

## 2016-04-29 DIAGNOSIS — E119 Type 2 diabetes mellitus without complications: Secondary | ICD-10-CM | POA: Diagnosis present

## 2016-04-29 DIAGNOSIS — I10 Essential (primary) hypertension: Secondary | ICD-10-CM | POA: Diagnosis present

## 2016-04-29 DIAGNOSIS — M542 Cervicalgia: Secondary | ICD-10-CM | POA: Diagnosis present

## 2016-04-29 DIAGNOSIS — K219 Gastro-esophageal reflux disease without esophagitis: Secondary | ICD-10-CM | POA: Diagnosis present

## 2016-04-29 DIAGNOSIS — M4802 Spinal stenosis, cervical region: Secondary | ICD-10-CM | POA: Diagnosis present

## 2016-04-29 HISTORY — PX: ANTERIOR CERVICAL DECOMP/DISCECTOMY FUSION: SHX1161

## 2016-04-29 LAB — GLUCOSE, CAPILLARY
GLUCOSE-CAPILLARY: 195 mg/dL — AB (ref 65–99)
GLUCOSE-CAPILLARY: 176 mg/dL — AB (ref 65–99)
Glucose-Capillary: 185 mg/dL — ABNORMAL HIGH (ref 65–99)
Glucose-Capillary: 191 mg/dL — ABNORMAL HIGH (ref 65–99)

## 2016-04-29 SURGERY — ANTERIOR CERVICAL DECOMPRESSION/DISCECTOMY FUSION 1 LEVEL
Anesthesia: General | Site: Spine Cervical

## 2016-04-29 MED ORDER — PHENYLEPHRINE HCL 10 MG/ML IJ SOLN
10.0000 mg | INTRAVENOUS | Status: DC | PRN
  Administered 2016-04-29: 10 ug/min via INTRAVENOUS

## 2016-04-29 MED ORDER — MIDAZOLAM HCL 2 MG/2ML IJ SOLN
INTRAMUSCULAR | Status: AC
  Filled 2016-04-29: qty 2

## 2016-04-29 MED ORDER — INSULIN PUMP
Freq: Three times a day (TID) | SUBCUTANEOUS | Status: DC
  Administered 2016-04-29: 22:00:00 via SUBCUTANEOUS
  Filled 2016-04-29: qty 1

## 2016-04-29 MED ORDER — PROPOFOL 10 MG/ML IV BOLUS
INTRAVENOUS | Status: DC | PRN
  Administered 2016-04-29: 200 mg via INTRAVENOUS

## 2016-04-29 MED ORDER — ACETAMINOPHEN 650 MG RE SUPP: 650.0000 mg | RECTAL | Status: DC | PRN

## 2016-04-29 MED ORDER — ONDANSETRON HCL 4 MG/2ML IJ SOLN
INTRAMUSCULAR | Status: DC | PRN
  Administered 2016-04-29: 4 mg via INTRAVENOUS

## 2016-04-29 MED ORDER — ONDANSETRON HCL 4 MG/2ML IJ SOLN: 4.0000 mg | INTRAMUSCULAR | Status: DC | PRN

## 2016-04-29 MED ORDER — SUGAMMADEX SODIUM 200 MG/2ML IV SOLN
INTRAVENOUS | Status: DC | PRN
  Administered 2016-04-29: 250 mg via INTRAVENOUS

## 2016-04-29 MED ORDER — LACTATED RINGERS IV SOLN: INTRAVENOUS | Status: DC

## 2016-04-29 MED ORDER — INSULIN ASPART 100 UNIT/ML ~~LOC~~ SOLN: 0.0000 [IU] | Freq: Three times a day (TID) | SUBCUTANEOUS | Status: DC

## 2016-04-29 MED ORDER — PROPOFOL 10 MG/ML IV BOLUS
INTRAVENOUS | Status: AC
  Filled 2016-04-29: qty 20

## 2016-04-29 MED ORDER — FENTANYL CITRATE (PF) 250 MCG/5ML IJ SOLN
INTRAMUSCULAR | Status: AC
  Filled 2016-04-29: qty 5

## 2016-04-29 MED ORDER — THROMBIN 5000 UNITS EX SOLR
OROMUCOSAL | Status: DC | PRN
  Administered 2016-04-29: 5 mL via TOPICAL

## 2016-04-29 MED ORDER — FENTANYL CITRATE (PF) 100 MCG/2ML IJ SOLN
INTRAMUSCULAR | Status: DC | PRN
  Administered 2016-04-29 (×5): 50 ug via INTRAVENOUS

## 2016-04-29 MED ORDER — DIAZEPAM 5 MG PO TABS: 5.0000 mg | ORAL_TABLET | Freq: Four times a day (QID) | ORAL | Status: DC | PRN

## 2016-04-29 MED ORDER — HYDROMORPHONE HCL 1 MG/ML IJ SOLN
0.2500 mg | INTRAMUSCULAR | Status: DC | PRN
  Administered 2016-04-29: 0.5 mg via INTRAVENOUS

## 2016-04-29 MED ORDER — 0.9 % SODIUM CHLORIDE (POUR BTL) OPTIME
TOPICAL | Status: DC | PRN
  Administered 2016-04-29: 1000 mL

## 2016-04-29 MED ORDER — EPHEDRINE SULFATE 50 MG/ML IJ SOLN
INTRAMUSCULAR | Status: DC | PRN
  Administered 2016-04-29: 10 mg via INTRAVENOUS

## 2016-04-29 MED ORDER — NITROGLYCERIN 0.4 MG SL SUBL: 0.4000 mg | SUBLINGUAL_TABLET | SUBLINGUAL | Status: DC | PRN

## 2016-04-29 MED ORDER — METOPROLOL TARTRATE 12.5 MG HALF TABLET
12.5000 mg | ORAL_TABLET | Freq: Two times a day (BID) | ORAL | Status: DC
  Administered 2016-04-29 – 2016-04-30 (×2): 12.5 mg via ORAL
  Filled 2016-04-29 (×2): qty 1

## 2016-04-29 MED ORDER — LIDOCAINE HCL (CARDIAC) 20 MG/ML IV SOLN
INTRAVENOUS | Status: DC | PRN
  Administered 2016-04-29: 100 mg via INTRAVENOUS

## 2016-04-29 MED ORDER — MENTHOL 3 MG MT LOZG: 1.0000 | LOZENGE | OROMUCOSAL | Status: DC | PRN

## 2016-04-29 MED ORDER — HEMOSTATIC AGENTS (NO CHARGE) OPTIME
TOPICAL | Status: DC | PRN
  Administered 2016-04-29: 1 via TOPICAL

## 2016-04-29 MED ORDER — MORPHINE SULFATE (PF) 2 MG/ML IV SOLN: 1.0000 mg | INTRAVENOUS | Status: DC | PRN

## 2016-04-29 MED ORDER — INSULIN ASPART 100 UNIT/ML ~~LOC~~ SOLN: 0.0000 [IU] | Freq: Every day | SUBCUTANEOUS | Status: DC

## 2016-04-29 MED ORDER — SODIUM CHLORIDE 0.9 % IV SOLN: INTRAVENOUS | Status: DC

## 2016-04-29 MED ORDER — SENNA 8.6 MG PO TABS
1.0000 | ORAL_TABLET | Freq: Two times a day (BID) | ORAL | Status: DC
Start: 2016-04-29 — End: 2016-04-30
  Administered 2016-04-29 – 2016-04-30 (×2): 8.6 mg via ORAL
  Filled 2016-04-29 (×2): qty 1

## 2016-04-29 MED ORDER — PHENOL 1.4 % MT LIQD: 1.0000 | OROMUCOSAL | Status: DC | PRN

## 2016-04-29 MED ORDER — THROMBIN 5000 UNITS EX SOLR
CUTANEOUS | Status: DC | PRN
  Administered 2016-04-29 (×2): 5000 [IU] via TOPICAL

## 2016-04-29 MED ORDER — HYDROMORPHONE HCL 1 MG/ML IJ SOLN
INTRAMUSCULAR | Status: AC
  Filled 2016-04-29: qty 1

## 2016-04-29 MED ORDER — ROCURONIUM BROMIDE 100 MG/10ML IV SOLN
INTRAVENOUS | Status: DC | PRN
  Administered 2016-04-29 (×2): 50 mg via INTRAVENOUS
  Administered 2016-04-29: 10 mg via INTRAVENOUS

## 2016-04-29 MED ORDER — ACETAMINOPHEN 325 MG PO TABS: 650.0000 mg | ORAL_TABLET | ORAL | Status: DC | PRN

## 2016-04-29 MED ORDER — ROCURONIUM BROMIDE 50 MG/5ML IV SOLN
INTRAVENOUS | Status: AC
  Filled 2016-04-29: qty 1

## 2016-04-29 MED ORDER — MIDAZOLAM HCL 5 MG/5ML IJ SOLN
INTRAMUSCULAR | Status: DC | PRN
  Administered 2016-04-29: 2 mg via INTRAVENOUS

## 2016-04-29 MED ORDER — OXYCODONE-ACETAMINOPHEN 5-325 MG PO TABS
1.0000 | ORAL_TABLET | ORAL | Status: DC | PRN
  Administered 2016-04-29 (×2): 1 via ORAL
  Administered 2016-04-30 (×2): 2 via ORAL
  Filled 2016-04-29: qty 2
  Filled 2016-04-29 (×2): qty 1
  Filled 2016-04-29: qty 2

## 2016-04-29 MED ORDER — SODIUM CHLORIDE 0.9 % IV SOLN: 250.0000 mL | INTRAVENOUS | Status: DC

## 2016-04-29 MED ORDER — LACTATED RINGERS IV SOLN
INTRAVENOUS | Status: DC
  Administered 2016-04-29 (×3): via INTRAVENOUS

## 2016-04-29 MED ORDER — ZOLPIDEM TARTRATE 5 MG PO TABS: 5.0000 mg | ORAL_TABLET | Freq: Every evening | ORAL | Status: DC | PRN

## 2016-04-29 MED ORDER — CEFAZOLIN SODIUM 1-5 GM-% IV SOLN
1.0000 g | Freq: Three times a day (TID) | INTRAVENOUS | Status: AC
  Administered 2016-04-29 – 2016-04-30 (×2): 1 g via INTRAVENOUS
  Filled 2016-04-29 (×2): qty 50

## 2016-04-29 MED ORDER — SODIUM CHLORIDE 0.9% FLUSH: 3.0000 mL | INTRAVENOUS | Status: DC | PRN

## 2016-04-29 MED ORDER — SODIUM CHLORIDE 0.9% FLUSH
3.0000 mL | Freq: Two times a day (BID) | INTRAVENOUS | Status: DC
  Administered 2016-04-29: 3 mL via INTRAVENOUS

## 2016-04-29 MED ORDER — ATORVASTATIN CALCIUM 80 MG PO TABS
80.0000 mg | ORAL_TABLET | Freq: Every day | ORAL | Status: DC
  Administered 2016-04-29: 80 mg via ORAL
  Filled 2016-04-29: qty 1

## 2016-04-29 SURGICAL SUPPLY — 53 items
BENZOIN TINCTURE PRP APPL 2/3 (GAUZE/BANDAGES/DRESSINGS) ×3 IMPLANT
BLADE ULTRA TIP 2M (BLADE) ×3 IMPLANT
BNDG GAUZE ELAST 4 BULKY (GAUZE/BANDAGES/DRESSINGS) ×6 IMPLANT
BUR BARREL STRAIGHT FLUTE 4.0 (BURR) IMPLANT
BUR MATCHSTICK NEURO 3.0 LAGG (BURR) ×3 IMPLANT
CANISTER SUCT 3000ML PPV (MISCELLANEOUS) ×3 IMPLANT
CLOSURE WOUND 1/2 X4 (GAUZE/BANDAGES/DRESSINGS) ×1
COVER MAYO STAND STRL (DRAPES) ×3 IMPLANT
DRAIN JACKSON PRATT 10MM FLAT (MISCELLANEOUS) ×3 IMPLANT
DRAPE C-ARM 42X72 X-RAY (DRAPES) IMPLANT
DRAPE LAPAROTOMY 100X72 PEDS (DRAPES) ×3 IMPLANT
DRAPE MICROSCOPE LEICA (MISCELLANEOUS) ×3 IMPLANT
DRAPE POUCH INSTRU U-SHP 10X18 (DRAPES) ×3 IMPLANT
DURAPREP 6ML APPLICATOR 50/CS (WOUND CARE) ×3 IMPLANT
ELECT REM PT RETURN 9FT ADLT (ELECTROSURGICAL) ×3
ELECTRODE REM PT RTRN 9FT ADLT (ELECTROSURGICAL) ×1 IMPLANT
EVACUATOR SILICONE 100CC (DRAIN) ×3 IMPLANT
GAUZE SPONGE 4X4 12PLY STRL (GAUZE/BANDAGES/DRESSINGS) ×3 IMPLANT
GAUZE SPONGE 4X4 16PLY XRAY LF (GAUZE/BANDAGES/DRESSINGS) IMPLANT
GLOVE BIOGEL M 8.0 STRL (GLOVE) ×3 IMPLANT
GLOVE BIOGEL PI IND STRL 7.0 (GLOVE) ×1 IMPLANT
GLOVE BIOGEL PI IND STRL 7.5 (GLOVE) ×2 IMPLANT
GLOVE BIOGEL PI INDICATOR 7.0 (GLOVE) ×2
GLOVE BIOGEL PI INDICATOR 7.5 (GLOVE) ×4
GLOVE ECLIPSE 7.0 STRL STRAW (GLOVE) ×3 IMPLANT
GLOVE EXAM NITRILE LRG STRL (GLOVE) IMPLANT
GLOVE EXAM NITRILE MD LF STRL (GLOVE) IMPLANT
GLOVE EXAM NITRILE XL STR (GLOVE) IMPLANT
GLOVE EXAM NITRILE XS STR PU (GLOVE) IMPLANT
GLOVE SURG SS PI 6.5 STRL IVOR (GLOVE) ×12 IMPLANT
GLOVE SURG SS PI 7.0 STRL IVOR (GLOVE) ×3 IMPLANT
GOWN STRL REUS W/ TWL LRG LVL3 (GOWN DISPOSABLE) ×3 IMPLANT
GOWN STRL REUS W/ TWL XL LVL3 (GOWN DISPOSABLE) ×1 IMPLANT
GOWN STRL REUS W/TWL 2XL LVL3 (GOWN DISPOSABLE) IMPLANT
GOWN STRL REUS W/TWL LRG LVL3 (GOWN DISPOSABLE) ×6
GOWN STRL REUS W/TWL XL LVL3 (GOWN DISPOSABLE) ×2
HEMOSTAT POWDER KIT SURGIFOAM (HEMOSTASIS) ×3 IMPLANT
KIT BASIN OR (CUSTOM PROCEDURE TRAY) ×3 IMPLANT
KIT ROOM TURNOVER OR (KITS) ×3 IMPLANT
NEEDLE SPNL 22GX3.5 QUINCKE BK (NEEDLE) ×3 IMPLANT
NS IRRIG 1000ML POUR BTL (IV SOLUTION) ×3 IMPLANT
PACK LAMINECTOMY NEURO (CUSTOM PROCEDURE TRAY) ×3 IMPLANT
PATTIES SURGICAL .5 X1 (DISPOSABLE) ×3 IMPLANT
RUBBERBAND STERILE (MISCELLANEOUS) ×6 IMPLANT
SPACER CERVICAL FRGE 12X14X7-7 (Spacer) ×3 IMPLANT
SPONGE INTESTINAL PEANUT (DISPOSABLE) ×6 IMPLANT
SPONGE SURGIFOAM ABS GEL SZ50 (HEMOSTASIS) ×3 IMPLANT
STRIP CLOSURE SKIN 1/2X4 (GAUZE/BANDAGES/DRESSINGS) ×2 IMPLANT
SUT VIC AB 3-0 SH 8-18 (SUTURE) ×6 IMPLANT
TAPE CLOTH SURG 4X10 WHT LF (GAUZE/BANDAGES/DRESSINGS) ×3 IMPLANT
TOWEL OR 17X24 6PK STRL BLUE (TOWEL DISPOSABLE) ×3 IMPLANT
TOWEL OR 17X26 10 PK STRL BLUE (TOWEL DISPOSABLE) ×3 IMPLANT
WATER STERILE IRR 1000ML POUR (IV SOLUTION) ×3 IMPLANT

## 2016-04-29 NOTE — Progress Notes (Signed)
Patient in room , denies pain, explained that he does not need any insulin coverage because his insulin pump regulates this "blood sugar". He said that he was instructed to reduce the supply from his pump for 12 hours, which he did and the pump is now back to its "normal setting"  MD office paged for further clarification/adjusment to patient's insulin order.-awaiting response

## 2016-04-29 NOTE — Progress Notes (Signed)
Accepted patient from PACU, patient is alert and oriented X4, denies pain. Will continue to monitor.

## 2016-04-29 NOTE — Transfer of Care (Signed)
Immediate Anesthesia Transfer of Care Note  Patient: Ian RudeCharles G Houston  Procedure(s) Performed: Procedure(s): CERVICAL FOUR-FIVE ANTERIOR CERVICAL DECOMPRESSION/DISKECTOMY/FUSION (N/A)  Patient Location: PACU  Anesthesia Type:General  Level of Consciousness: awake, alert  and oriented  Airway & Oxygen Therapy: Patient Spontanous Breathing and Patient connected to nasal cannula oxygen  Post-op Assessment: Report given to RN and Post -op Vital signs reviewed and stable  Post vital signs: Reviewed and stable  Last Vitals:  Filed Vitals:   04/29/16 0814  BP: 148/84  Pulse: 58  Temp: 36.7 C  Resp: 18    Last Pain:  Filed Vitals:   04/29/16 1409  PainSc: 6       Patients Stated Pain Goal: 5 (04/29/16 0831)  Complications: No apparent anesthesia complications

## 2016-04-29 NOTE — Anesthesia Postprocedure Evaluation (Signed)
Anesthesia Post Note  Patient: Ian RudeCharles G Houston  Procedure(s) Performed: Procedure(s) (LRB): CERVICAL FOUR-FIVE ANTERIOR CERVICAL DECOMPRESSION/DISKECTOMY/FUSION (N/A)  Patient location during evaluation: PACU Anesthesia Type: General Level of consciousness: awake and alert Pain management: pain level controlled Vital Signs Assessment: post-procedure vital signs reviewed and stable Respiratory status: spontaneous breathing, nonlabored ventilation, respiratory function stable and patient connected to nasal cannula oxygen Cardiovascular status: blood pressure returned to baseline and stable Postop Assessment: no signs of nausea or vomiting Anesthetic complications: no    Last Vitals:  Filed Vitals:   04/29/16 1530 04/29/16 1553  BP:  168/96  Pulse: 76 86  Temp:  36.7 C  Resp: 12 16    Last Pain:  Filed Vitals:   04/29/16 1554  PainSc: Asleep                 Symphanie Cederberg,Thaniel L

## 2016-04-29 NOTE — Progress Notes (Signed)
Per MD verbal order: insulin sliding scale order  is discontinued since patient is alert enough to manage his insulin pump and wife also at bedside.

## 2016-04-29 NOTE — Progress Notes (Signed)
Patient is currently on Novolog MODERATE correction scale TID &HS. In reviewing the past chart history, patient has been on insulin pump. The pump has been stopped for surgery. Recommend starting Levemir 35 units daily, Novolog SENSITIVE correction scale TID & HS, and Novolog 6 units TID meal coverage if patient is eating while in the hospital. Will continue to follow. Smith MinceKendra Chrisann Melaragno RN BSN CDE

## 2016-04-29 NOTE — Anesthesia Preprocedure Evaluation (Signed)
Anesthesia Evaluation  Patient identified by MRN, date of birth, ID band Patient awake    Reviewed: Allergy & Precautions, H&P , NPO status , Patient's Chart, lab work & pertinent test results, reviewed documented beta blocker date and time   Airway Mallampati: II  TM Distance: >3 FB Neck ROM: full    Dental no notable dental hx. (+) Dental Advisory Given, Teeth Intact   Pulmonary neg pulmonary ROS, Current Smoker,    Pulmonary exam normal breath sounds clear to auscultation       Cardiovascular hypertension, Pt. on home beta blockers + CAD, + Past MI and + Cardiac Stents  Normal cardiovascular exam Rhythm:regular Rate:Normal  Good LV function   Neuro/Psych negative neurological ROS  negative psych ROS   GI/Hepatic negative GI ROS, Neg liver ROS,   Endo/Other  diabetes, Well Controlled, Type 2, Insulin Dependent  Renal/GU negative Renal ROS  negative genitourinary   Musculoskeletal   Abdominal   Peds  Hematology negative hematology ROS (+)   Anesthesia Other Findings   Reproductive/Obstetrics negative OB ROS                             Anesthesia Physical Anesthesia Plan  ASA: III  Anesthesia Plan: General   Post-op Pain Management:    Induction: Intravenous  Airway Management Planned: Oral ETT  Additional Equipment:   Intra-op Plan:   Post-operative Plan: Extubation in OR  Informed Consent: I have reviewed the patients History and Physical, chart, labs and discussed the procedure including the risks, benefits and alternatives for the proposed anesthesia with the patient or authorized representative who has indicated his/her understanding and acceptance.   Dental Advisory Given  Plan Discussed with: CRNA and Surgeon  Anesthesia Plan Comments:         Anesthesia Quick Evaluation

## 2016-04-29 NOTE — Anesthesia Procedure Notes (Signed)
Procedure Name: Intubation Date/Time: 04/29/2016 11:16 AM Performed by: Dairl PonderJIANG, Tagen Milby Pre-anesthesia Checklist: Patient identified, Timeout performed, Emergency Drugs available, Suction available and Patient being monitored Patient Re-evaluated:Patient Re-evaluated prior to inductionOxygen Delivery Method: Circle system utilized Preoxygenation: Pre-oxygenation with 100% oxygen Intubation Type: IV induction Ventilation: Mask ventilation without difficulty and Oral airway inserted - appropriate to patient size Laryngoscope Size: Glidescope and 4 (limited neck ROM) Grade View: Grade I Tube type: Oral Tube size: 7.5 mm Number of attempts: 1 Airway Equipment and Method: Stylet Placement Confirmation: ETT inserted through vocal cords under direct vision,  breath sounds checked- equal and bilateral and positive ETCO2 Secured at: 26 cm Tube secured with: Tape Dental Injury: Teeth and Oropharynx as per pre-operative assessment

## 2016-04-29 NOTE — Progress Notes (Signed)
Patient ID: Ian RudeCharles G Houston, male   DOB: [birth year], 55 y.o.   MRN: [id number] Awake. To get a hard brace. Family aware

## 2016-04-30 LAB — GLUCOSE, CAPILLARY
GLUCOSE-CAPILLARY: 76 mg/dL (ref 65–99)
Glucose-Capillary: 83 mg/dL (ref 65–99)
Glucose-Capillary: 80 mg/dL (ref 65–99)

## 2016-04-30 MED ORDER — OXYCODONE-ACETAMINOPHEN 5-325 MG PO TABS: 1.0000 | ORAL_TABLET | ORAL | Status: DC | PRN

## 2016-04-30 NOTE — Progress Notes (Signed)
Pt to go home wiuth wife via wheelchair.pt understood all discharge instructions

## 2016-04-30 NOTE — Op Note (Signed)
[name]:  Houston, Ian                 ACCOUNT NO.:  [id number]  MEDICAL RECORD NO.:  [id number]  LOCATION:  5C17C                        FACILITY:  [facility]  PHYSICIAN:  Hilda LiasErnesto Tiffanie Blassingame, M.D.   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  Status post cervical fusion, 3-4, 5-6, and 6-7.  Diabetes mellitus.  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  Status post cervical fusion, 3-4, 5-6, and 6-7.  Diabetes mellitus.  PROCEDURES:  Anterior cervical diskectomy, 4-5.  Decompression of the spinal cord.  Foraminotomy.  Interbody fusion with allograft. Microscope.  SURGEON:  Hilda LiasErnesto Chantry Headen, M.D.  ASSISTANT:  Dr. Conchita ParisNundkumar.  CLINICAL HISTORY:  Ian Houston is a gentleman, who in the past underwent fusion at the level of cervical 3-4, 5-6, and 6-7.  A year ago, he had an episode where he was found himself on the floor with a burning sensation in the arm.  X-ray shows severe stenosis at the level of 4-5. Unfortunately, he has a cardiac problem and he had to be on blood thinner for at least a year.  Now, he is stable, he came for a followup. We agreed with surgery.  The patient knew the risk of the surgery including the possibility of damage to the vocal cord, perforation of the esophagus, no improvement whatsoever, being unable to do the surgery itself.  DESCRIPTION OF PROCEDURE:  The patient was taken to the OR, and after intubation, the left side of the neck where he had previous surgery was cleaned with DuraPrep.  Drapes were applied.  Incision was made from the vertical incision through the skin, subcutaneous tissue, platysma.  The difficult part of the surgery itself was dissection.  The patient had quite a bit of scar tissue from the soft tissue, subcutaneous space down.  With the help of Dr. Conchita ParisNundkumar, we started dissecting the plane. At the end, we were able to  retract the esophagus and trachea midline and the carotid and jugular vein laterally.  X-rays showed indeed we were right at the level of 4-5.  Dissection was carried out and we were able to see the plate above and below.  Incision was made in the disk space and using the microcurette as well as the drill, we did a total diskectomy.  The patient had quite a bit of posterior ligament adhesion. Lysis was accomplished and at the end, we have a good space for the spinal cord above and below as well as the lamina.  Then, the endplates were drilled.  We inserted an allograft of 7-mm, lordotic.  Because there was no space above, I was unable to put a plate.  The patient is going to be in hard collar.  Then, using methylene blue as well as the saline solution, we put a tube in the stomach, which was withdrawn in its way out just to be sure that we did not have any leak in esophagus.  The test was negative.  Then, a drain was left in the operative site and the wound was closed with Vicryl and a Steri-Strip.  ______________________________ Hilda Lias, M.D.     EB/MEDQ  D:  04/29/2016  T:  04/30/2016  Job:  [id number]

## 2016-04-30 NOTE — Discharge Summary (Signed)
Physician Discharge Summary  Patient ID: Ian RudeCharles G Langhorst MRN: [id number] DOB/AGE: [birth year] 55 y.o.  Admit date: 04/29/2016 Discharge date: 04/30/2016  Admission Diagnoses:  Cervical stenosis  Discharge Diagnoses:  Cervical stenosis Active Problems:   Cervical stenosis of spinal canal   Discharged Condition: good  Hospital Course: Patient admitted by Dr. Jeral FruitBotero, who performed a C4-5 ACDF. Postoperatively he has been immobilized in a Aspen cervical collar. He's had minimal drainage from his Jackson-Pratt drain, which was removed. His incision is healing nicely. He is up and ambulate actively. He is being discharged home, and instructions regarding wound care and activity have been given to both he and his wife. I've asked that he contact the office after the holiday weekend to set up a follow-up appointment for couple of weeks from now with Dr. Jeral FruitBotero.  Discharge Exam: Blood pressure 132/78, pulse 65, temperature 97.9 F (36.6 C), temperature source Oral, resp. rate 18, height 6' 1.5" (1.867 m), weight 108.228 kg (238 lb 9.6 oz), SpO2 94 %.  Disposition: 01-Home or Self Care     Medication List    TAKE these medications        aspirin 81 MG chewable tablet  Chew 81 mg by mouth daily.     atorvastatin 80 MG tablet  Commonly known as:  LIPITOR  Take 1 tablet (80 mg total) by mouth daily at 6 PM.     insulin pump Soln  bolus as directed 4-6 times per day     metoprolol tartrate 25 MG tablet  Commonly known as:  LOPRESSOR  Take 12.5 mg by mouth 2 (two) times daily.     nitroGLYCERIN 0.4 MG SL tablet  Commonly known as:  NITROSTAT  Place 1 tablet (0.4 mg total) under the tongue every 5 (five) minutes as needed for chest pain.     oxyCODONE-acetaminophen 5-325 MG tablet  Commonly known as:  PERCOCET/ROXICET  Take 1-2 tablets by mouth every 4 (four) hours as needed for moderate pain.         SignedHewitt Shorts: NUDELMAN,ROBERT W 04/30/2016, 10:35 AM

## 2016-05-02 ENCOUNTER — Encounter (HOSPITAL_COMMUNITY): Payer: Self-pay | Admitting: Neurosurgery

## 2016-11-01 ENCOUNTER — Encounter: Payer: Self-pay | Admitting: Cardiology

## 2016-11-09 ENCOUNTER — Encounter (INDEPENDENT_AMBULATORY_CARE_PROVIDER_SITE_OTHER): Payer: Self-pay

## 2016-11-09 ENCOUNTER — Ambulatory Visit (INDEPENDENT_AMBULATORY_CARE_PROVIDER_SITE_OTHER): Payer: 59 | Admitting: Cardiology

## 2016-11-09 VITALS — BP 142/72 | HR 80 | Ht 73.0 in | Wt 232.4 lb

## 2016-11-09 DIAGNOSIS — I2510 Atherosclerotic heart disease of native coronary artery without angina pectoris: Secondary | ICD-10-CM | POA: Diagnosis not present

## 2016-11-09 DIAGNOSIS — E7800 Pure hypercholesterolemia, unspecified: Secondary | ICD-10-CM

## 2016-11-09 DIAGNOSIS — I10 Essential (primary) hypertension: Secondary | ICD-10-CM | POA: Diagnosis not present

## 2016-11-09 NOTE — Patient Instructions (Signed)

## 2016-11-09 NOTE — Progress Notes (Signed)
Cardiology Office Note   Date:  11/09/2016   ID:  Ian RudeCharles G Nadal, DOB [birth year], MRN [id number]  PCP:  Gwen PoundsUSSO,JOHN M, MD  Cardiologist:  Dr. Donato SchultzMark Bijan Houston    History of Present Illness: Ian Houston is a 55 y.o. male with a hx of T1DM and former smoker, no longer dipping.  He was admitted 10/22-10/27/15 with DKA and hyperkalemia in the setting of insulin pump failure.  Initial EKG demonstrated lateral ST changes initially thought to be related to metabolic derangement.  However, he subsequently ruled in for NSTEMI (peak Tn 7.5).  He was taken for cardiac catheterization. This demonstrated severe stenosis in the mid LAD as well as high-grade stenosis in a small D1. LAD was treated with a DES. Ejection fraction remained preserved.   He was on DAPT x 1 year (stopped in Oct 2016) and remains on ASA daily.  He returns for follow-up.  He is doing well. He denies chest discomfort, shortness of breath, syncope, orthopnea, PND or edema.  He reports snoring and has known diagnosis of OSA for which he uses an oral appliance.  Had neck surgery in 2011 and 2017. 7 weeks of neck immobilization. Occasionally he will have sharp fleeting pinpoint chest discomfort. Atypical. Noncardiac. 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  Studies:  - LHC (09/29/14):  Mid LAD 90%, proximal D1 90% (small), proximal RCA 20-30%, EF 60% >> PCI:  24 mm x 3.0 Promus Premier DES to LAD.  - Echo (10/15):  Vigorous LVF, EF 65-70%, normal wall motion, mild MR   Recent Labs/Images:  Wt Readings from Last 3 Encounters:  11/09/16 232 lb 6.4 oz (105.4 kg)  04/29/16 238 lb 9.6 oz (108.2 kg)  04/26/16 238 lb 9.6 oz (108.2 kg)     Past Medical History:  Diagnosis Date  . CAD (coronary artery disease)    a. s/p NSTEMI in the setting of DKA 10/15 >>> LHC (09/29/14):  Mid LAD 90%, proximal D1 90% (small), proximal RCA 20-30%, EF 60% >> PCI:  24 mm x 3.0 Promus Premier DES to LAD.  Marland Kitchen.  Complication of anesthesia    states difficult to wake up  . Diabetes mellitus without complication (HCC)   . GERD (gastroesophageal reflux disease)   . History of echocardiogram    a.  Echo (10/15):  Vigorous LVF, EF 65-70%, normal wall motion, mild MR  . Hyperlipidemia   . Hypertension   . Tobacco abuse    a. chewing tobacco    Current Outpatient Prescriptions  Medication Sig Dispense Refill  . aspirin 81 MG chewable tablet Chew 81 mg by mouth daily.    Marland Kitchen. atorvastatin (LIPITOR) 80 MG tablet Take 1 tablet (80 mg total) by mouth daily at 6 PM. 30 tablet 11  . Insulin Human (INSULIN PUMP) SOLN bolus as directed 4-6 times per day    . metoprolol tartrate (LOPRESSOR) 25 MG tablet Take 12.5 mg by mouth 2 (two) times daily.    . nitroGLYCERIN (NITROSTAT) 0.4 MG SL tablet Place 1 tablet (0.4 mg total) under the tongue every 5 (five) minutes as needed for chest pain. 25 tablet 12   No current facility-administered medications for this visit.      Allergies:   Sulfa antibiotics   Social History:  The patient  reports that he has been smoking Cigars.  He has smoked for the past 20.00 years. He has quit using smokeless tobacco. His smokeless tobacco use included Chew.  He reports that he drinks about 8.4 oz of alcohol per week . He reports that he does not use drugs.   Family History:  The patient's family history includes Emphysema in his mother; Leukemia in his paternal grandmother.   ROS:  Please see the history of present illness. + snoring.  All other systems reviewed and negative.   PHYSICAL EXAM: VS:  BP (!) 142/72   Pulse 80   Ht 6\' 1"  (1.854 m)   Wt 232 lb 6.4 oz (105.4 kg)   BMI 30.66 kg/m  Well nourished, well developed, in no acute distress, [employer] uniform, weapon at side.   HEENT: normal  Neck:  no JVD  Cardiac:  normal S1, S2;  RRR; no murmur  Lungs:   clear to auscultation bilaterally, no wheezing, rhonchi or rales  Abd: soft, nontender, no hepatomegaly    Ext:   No LE edema  Skin: warm and dry  Neuro: no focal abnormalities noted  EKG:  Today 11/05/15 - NSR, HR 67, normal axis, no ST changes    Echocardiogram: 09/26/14 - EF 65-70%  ASSESSMENT AND PLAN:  1.  Coronary artery disease:  Doing well after non-STEMI in the setting of DKA treated with a DES to the LAD 09/29/14.  Was on DAPT x 1 year (stopped in Oct 2016).   No cardiac symptoms. Sharp fleeting chest discomfort is likely musculoskeletal.   -  Continue with ASA, high dose statin, BB   -  If symptoms occur that are more worrisome for anginal symptoms, we will proceed with stress test.  2.  Hyperlipidemia:  Managed by Dr. Timothy Lassousso. Goal LDL less than 70. Doing well, no adverse drug reaction.  On high-dose statin.   3.  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  Recent A1c 7.  Aim for good control.  Follow-up by Dr. Timothy Lassousso.  4. Cervical spine disc injury- followed by Dr. Jeral FruitBotero. Successful second surgery in 2017.   5. Former tobacco user-smoked several years ago.  Reported occasional cigar use.  Counseled to decrease cigar use.    Disposition:   FU in 1 year.   Signed Donato SchultzMark Obie Silos, MD 11/09/2016 9:43 AM    [facility] [address], [place], [place]  [address] Phone: [phone]; Fax: [phone]

## 2016-12-26 DIAGNOSIS — Z0000 Encounter for general adult medical examination without abnormal findings: Secondary | ICD-10-CM | POA: Diagnosis not present

## 2017-01-02 DIAGNOSIS — Z1389 Encounter for screening for other disorder: Secondary | ICD-10-CM | POA: Diagnosis not present

## 2017-01-02 DIAGNOSIS — Z0000 Encounter for general adult medical examination without abnormal findings: Secondary | ICD-10-CM | POA: Diagnosis not present

## 2017-01-02 DIAGNOSIS — Z4681 Encounter for fitting and adjustment of insulin pump: Secondary | ICD-10-CM | POA: Diagnosis not present

## 2017-01-02 DIAGNOSIS — I2510 Atherosclerotic heart disease of native coronary artery without angina pectoris: Secondary | ICD-10-CM | POA: Diagnosis not present

## 2017-01-02 DIAGNOSIS — Z125 Encounter for screening for malignant neoplasm of prostate: Secondary | ICD-10-CM | POA: Diagnosis not present

## 2017-01-02 DIAGNOSIS — M6530 Trigger finger, unspecified finger: Secondary | ICD-10-CM | POA: Diagnosis not present

## 2017-01-02 DIAGNOSIS — Z23 Encounter for immunization: Secondary | ICD-10-CM | POA: Diagnosis not present

## 2017-02-07 DIAGNOSIS — Z794 Long term (current) use of insulin: Secondary | ICD-10-CM | POA: Diagnosis not present

## 2017-02-07 DIAGNOSIS — E109 Type 1 diabetes mellitus without complications: Secondary | ICD-10-CM | POA: Diagnosis not present

## 2017-02-07 DIAGNOSIS — E1065 Type 1 diabetes mellitus with hyperglycemia: Secondary | ICD-10-CM | POA: Diagnosis not present

## 2017-02-16 DIAGNOSIS — K760 Fatty (change of) liver, not elsewhere classified: Secondary | ICD-10-CM | POA: Diagnosis not present

## 2017-04-05 DIAGNOSIS — M65342 Trigger finger, left ring finger: Secondary | ICD-10-CM | POA: Diagnosis not present

## 2017-05-05 DIAGNOSIS — E109 Type 1 diabetes mellitus without complications: Secondary | ICD-10-CM | POA: Diagnosis not present

## 2017-05-05 DIAGNOSIS — Z794 Long term (current) use of insulin: Secondary | ICD-10-CM | POA: Diagnosis not present

## 2017-05-05 DIAGNOSIS — E1065 Type 1 diabetes mellitus with hyperglycemia: Secondary | ICD-10-CM | POA: Diagnosis not present

## 2017-05-08 DIAGNOSIS — E108 Type 1 diabetes mellitus with unspecified complications: Secondary | ICD-10-CM | POA: Diagnosis not present

## 2017-05-09 DIAGNOSIS — E1065 Type 1 diabetes mellitus with hyperglycemia: Secondary | ICD-10-CM | POA: Diagnosis not present

## 2017-05-09 DIAGNOSIS — E108 Type 1 diabetes mellitus with unspecified complications: Secondary | ICD-10-CM | POA: Diagnosis not present

## 2017-05-09 DIAGNOSIS — I2510 Atherosclerotic heart disease of native coronary artery without angina pectoris: Secondary | ICD-10-CM | POA: Diagnosis not present

## 2017-05-25 DIAGNOSIS — E109 Type 1 diabetes mellitus without complications: Secondary | ICD-10-CM | POA: Diagnosis not present

## 2017-05-25 DIAGNOSIS — Z794 Long term (current) use of insulin: Secondary | ICD-10-CM | POA: Diagnosis not present

## 2017-05-25 DIAGNOSIS — E1065 Type 1 diabetes mellitus with hyperglycemia: Secondary | ICD-10-CM | POA: Diagnosis not present

## 2017-06-05 DIAGNOSIS — I2510 Atherosclerotic heart disease of native coronary artery without angina pectoris: Secondary | ICD-10-CM | POA: Diagnosis not present

## 2017-06-05 DIAGNOSIS — Z4681 Encounter for fitting and adjustment of insulin pump: Secondary | ICD-10-CM | POA: Diagnosis not present

## 2017-06-05 DIAGNOSIS — E1065 Type 1 diabetes mellitus with hyperglycemia: Secondary | ICD-10-CM | POA: Diagnosis not present

## 2017-06-26 DIAGNOSIS — E1065 Type 1 diabetes mellitus with hyperglycemia: Secondary | ICD-10-CM | POA: Diagnosis not present

## 2017-06-26 DIAGNOSIS — Z794 Long term (current) use of insulin: Secondary | ICD-10-CM | POA: Diagnosis not present

## 2017-06-26 DIAGNOSIS — E109 Type 1 diabetes mellitus without complications: Secondary | ICD-10-CM | POA: Diagnosis not present

## 2017-08-03 DIAGNOSIS — I2510 Atherosclerotic heart disease of native coronary artery without angina pectoris: Secondary | ICD-10-CM | POA: Diagnosis not present

## 2017-08-03 DIAGNOSIS — E1065 Type 1 diabetes mellitus with hyperglycemia: Secondary | ICD-10-CM | POA: Diagnosis not present

## 2017-08-03 DIAGNOSIS — Z4681 Encounter for fitting and adjustment of insulin pump: Secondary | ICD-10-CM | POA: Diagnosis not present

## 2017-08-16 DIAGNOSIS — H2513 Age-related nuclear cataract, bilateral: Secondary | ICD-10-CM | POA: Diagnosis not present

## 2017-08-16 DIAGNOSIS — E119 Type 2 diabetes mellitus without complications: Secondary | ICD-10-CM | POA: Diagnosis not present

## 2017-08-16 DIAGNOSIS — H25013 Cortical age-related cataract, bilateral: Secondary | ICD-10-CM | POA: Diagnosis not present

## 2017-09-05 DIAGNOSIS — E1065 Type 1 diabetes mellitus with hyperglycemia: Secondary | ICD-10-CM | POA: Diagnosis not present

## 2017-09-29 ENCOUNTER — Encounter (HOSPITAL_COMMUNITY): Payer: Self-pay | Admitting: *Deleted

## 2017-09-29 ENCOUNTER — Observation Stay (HOSPITAL_COMMUNITY)
Admission: EM | Admit: 2017-09-29 | Discharge: 2017-10-01 | Disposition: A | Discharge status: Home / Self Care | Payer: 59 | Attending: Internal Medicine | Admitting: Internal Medicine

## 2017-09-29 ENCOUNTER — Inpatient Hospital Stay (HOSPITAL_COMMUNITY): Payer: 59

## 2017-09-29 DIAGNOSIS — Z794 Long term (current) use of insulin: Secondary | ICD-10-CM | POA: Insufficient documentation

## 2017-09-29 DIAGNOSIS — F17290 Nicotine dependence, other tobacco product, uncomplicated: Secondary | ICD-10-CM | POA: Diagnosis not present

## 2017-09-29 DIAGNOSIS — R531 Weakness: Secondary | ICD-10-CM | POA: Insufficient documentation

## 2017-09-29 DIAGNOSIS — K219 Gastro-esophageal reflux disease without esophagitis: Secondary | ICD-10-CM | POA: Insufficient documentation

## 2017-09-29 DIAGNOSIS — E1010 Type 1 diabetes mellitus with ketoacidosis without coma: Secondary | ICD-10-CM | POA: Diagnosis not present

## 2017-09-29 DIAGNOSIS — I2510 Atherosclerotic heart disease of native coronary artery without angina pectoris: Secondary | ICD-10-CM | POA: Insufficient documentation

## 2017-09-29 DIAGNOSIS — I10 Essential (primary) hypertension: Secondary | ICD-10-CM | POA: Insufficient documentation

## 2017-09-29 DIAGNOSIS — E1110 Type 2 diabetes mellitus with ketoacidosis without coma: Secondary | ICD-10-CM | POA: Diagnosis not present

## 2017-09-29 DIAGNOSIS — D72829 Elevated white blood cell count, unspecified: Secondary | ICD-10-CM | POA: Diagnosis present

## 2017-09-29 DIAGNOSIS — Z79899 Other long term (current) drug therapy: Secondary | ICD-10-CM | POA: Insufficient documentation

## 2017-09-29 DIAGNOSIS — E1165 Type 2 diabetes mellitus with hyperglycemia: Secondary | ICD-10-CM | POA: Insufficient documentation

## 2017-09-29 DIAGNOSIS — J9811 Atelectasis: Secondary | ICD-10-CM | POA: Diagnosis not present

## 2017-09-29 LAB — BLOOD GAS, ARTERIAL
ACID-BASE DEFICIT: 14.3 mmol/L — AB (ref 0.0–2.0)
Acid-base deficit: 5.7 mmol/L — ABNORMAL HIGH (ref 0.0–2.0)
BICARBONATE: 11.3 mmol/L — AB (ref 20.0–28.0)
Bicarbonate: 18.3 mmol/L — ABNORMAL LOW (ref 20.0–28.0)
DRAWN BY: 295031
DRAWN BY: 11249
FIO2: 21
FIO2: 0.21
O2 SAT: 95 %
O2 Saturation: 94.4 %
PATIENT TEMPERATURE: 98.6
PCO2 ART: 32.6 mmHg (ref 32.0–48.0)
PH ART: 7.256 — AB (ref 7.350–7.450)
Patient temperature: 98.6
pCO2 arterial: 26.3 mmHg — ABNORMAL LOW (ref 32.0–48.0)
pH, Arterial: 7.367 (ref 7.350–7.450)
pO2, Arterial: 78.9 mmHg — ABNORMAL LOW (ref 83.0–108.0)
pO2, Arterial: 71.7 mmHg — ABNORMAL LOW (ref 83.0–108.0)

## 2017-09-29 LAB — GLUCOSE, CAPILLARY
GLUCOSE-CAPILLARY: 128 mg/dL — AB (ref 65–99)
GLUCOSE-CAPILLARY: 197 mg/dL — AB (ref 65–99)
GLUCOSE-CAPILLARY: 236 mg/dL — AB (ref 65–99)
GLUCOSE-CAPILLARY: 282 mg/dL — AB (ref 65–99)
Glucose-Capillary: 329 mg/dL — ABNORMAL HIGH (ref 65–99)
Glucose-Capillary: 304 mg/dL — ABNORMAL HIGH (ref 65–99)
Glucose-Capillary: 274 mg/dL — ABNORMAL HIGH (ref 65–99)
Glucose-Capillary: 207 mg/dL — ABNORMAL HIGH (ref 65–99)
Glucose-Capillary: 171 mg/dL — ABNORMAL HIGH (ref 65–99)
Glucose-Capillary: 134 mg/dL — ABNORMAL HIGH (ref 65–99)
Glucose-Capillary: 139 mg/dL — ABNORMAL HIGH (ref 65–99)

## 2017-09-29 LAB — URINALYSIS, ROUTINE W REFLEX MICROSCOPIC
BILIRUBIN URINE: NEGATIVE
Ketones, ur: 80 mg/dL — AB
Leukocytes, UA: NEGATIVE
NITRITE: NEGATIVE
PROTEIN: 30 mg/dL — AB
SPECIFIC GRAVITY, URINE: 1.021 (ref 1.005–1.030)
pH: 5 (ref 5.0–8.0)

## 2017-09-29 LAB — CBG MONITORING, ED
GLUCOSE-CAPILLARY: 416 mg/dL — AB (ref 65–99)
GLUCOSE-CAPILLARY: 348 mg/dL — AB (ref 65–99)
Glucose-Capillary: 430 mg/dL — ABNORMAL HIGH (ref 65–99)

## 2017-09-29 LAB — BASIC METABOLIC PANEL
ANION GAP: 19 — AB (ref 5–15)
ANION GAP: 14 (ref 5–15)
ANION GAP: 7 (ref 5–15)
Anion gap: 28 — ABNORMAL HIGH (ref 5–15)
BUN: 21 mg/dL — AB (ref 6–20)
BUN: 24 mg/dL — AB (ref 6–20)
BUN: 26 mg/dL — ABNORMAL HIGH (ref 6–20)
BUN: 26 mg/dL — ABNORMAL HIGH (ref 6–20)
CALCIUM: 9.8 mg/dL (ref 8.9–10.3)
CHLORIDE: 104 mmol/L (ref 101–111)
CHLORIDE: 108 mmol/L (ref 101–111)
CHLORIDE: 109 mmol/L (ref 101–111)
CO2: 10 mmol/L — ABNORMAL LOW (ref 22–32)
CO2: 14 mmol/L — ABNORMAL LOW (ref 22–32)
CO2: 13 mmol/L — ABNORMAL LOW (ref 22–32)
CO2: 19 mmol/L — ABNORMAL LOW (ref 22–32)
Calcium: 9.2 mg/dL (ref 8.9–10.3)
Calcium: 9.3 mg/dL (ref 8.9–10.3)
Calcium: 9.5 mg/dL (ref 8.9–10.3)
Chloride: 96 mmol/L — ABNORMAL LOW (ref 101–111)
Creatinine, Ser: 1.33 mg/dL — ABNORMAL HIGH (ref 0.61–1.24)
Creatinine, Ser: 1.37 mg/dL — ABNORMAL HIGH (ref 0.61–1.24)
Creatinine, Ser: 1.2 mg/dL (ref 0.61–1.24)
Creatinine, Ser: 1.02 mg/dL (ref 0.61–1.24)
GFR calc Af Amer: >60 mL/min (ref >60)
GFR, EST NON AFRICAN AMERICAN: 58 mL/min — AB (ref >60)
GFR, EST NON AFRICAN AMERICAN: 56 mL/min — AB (ref >60)
GLUCOSE: 398 mg/dL — AB (ref 65–99)
Glucose, Bld: 335 mg/dL — ABNORMAL HIGH (ref 65–99)
Glucose, Bld: 236 mg/dL — ABNORMAL HIGH (ref 65–99)
Glucose, Bld: 172 mg/dL — ABNORMAL HIGH (ref 65–99)
POTASSIUM: 4.8 mmol/L (ref 3.5–5.1)
POTASSIUM: 5 mmol/L (ref 3.5–5.1)
POTASSIUM: 4.4 mmol/L (ref 3.5–5.1)
Potassium: 5.5 mmol/L — ABNORMAL HIGH (ref 3.5–5.1)
SODIUM: 137 mmol/L (ref 135–145)
SODIUM: 135 mmol/L (ref 135–145)
SODIUM: 135 mmol/L (ref 135–145)
Sodium: 134 mmol/L — ABNORMAL LOW (ref 135–145)

## 2017-09-29 LAB — DIFFERENTIAL
BASOS PCT: 0 %
Basophils Absolute: 0 10*3/uL (ref 0.0–0.1)
EOS PCT: 0 %
Eosinophils Absolute: 0 10*3/uL (ref 0.0–0.7)
LYMPHS ABS: 1.4 10*3/uL (ref 0.7–4.0)
Lymphocytes Relative: 6 %
MONOS PCT: 5 %
Monocytes Absolute: 1.2 10*3/uL — ABNORMAL HIGH (ref 0.1–1.0)
NEUTROS ABS: 21.1 10*3/uL — AB (ref 1.7–7.7)
Neutrophils Relative %: 89 %

## 2017-09-29 LAB — CBC
HCT: 48.4 % (ref 39.0–52.0)
HEMATOCRIT: 46.2 % (ref 39.0–52.0)
Hemoglobin: 16.8 g/dL (ref 13.0–17.0)
Hemoglobin: 15.6 g/dL (ref 13.0–17.0)
MCH: 31.5 pg (ref 26.0–34.0)
MCH: 30.6 pg (ref 26.0–34.0)
MCHC: 34.7 g/dL (ref 30.0–36.0)
MCHC: 33.8 g/dL (ref 30.0–36.0)
MCV: 90.8 fL (ref 78.0–100.0)
MCV: 90.8 fL (ref 78.0–100.0)
Platelets: 296 10*3/uL (ref 150–400)
Platelets: 287 10*3/uL (ref 150–400)
RBC: 5.33 MIL/uL (ref 4.22–5.81)
RBC: 5.09 MIL/uL (ref 4.22–5.81)
RDW: 12.7 % (ref 11.5–15.5)
RDW: 12.8 % (ref 11.5–15.5)
WBC: 23.7 10*3/uL — ABNORMAL HIGH (ref 4.0–10.5)
WBC: 22.8 10*3/uL — AB (ref 4.0–10.5)

## 2017-09-29 LAB — BLOOD GAS, VENOUS
ACID-BASE DEFICIT: 20.3 mmol/L — AB (ref 0.0–2.0)
BICARBONATE: 9.9 mmol/L — AB (ref 20.0–28.0)
O2 Saturation: 64.7 %
PATIENT TEMPERATURE: 37
PO2 VEN: 44.4 mmHg (ref 32.0–45.0)
pCO2, Ven: 33.8 mmHg — ABNORMAL LOW (ref 44.0–60.0)
pH, Ven: 7.092 — CL (ref 7.250–7.430)

## 2017-09-29 LAB — MAGNESIUM: Magnesium: 2.2 mg/dL (ref 1.7–2.4)

## 2017-09-29 LAB — MRSA PCR SCREENING: MRSA BY PCR: NEGATIVE

## 2017-09-29 LAB — PHOSPHORUS: Phosphorus: 5.8 mg/dL — ABNORMAL HIGH (ref 2.5–4.6)

## 2017-09-29 MED ORDER — INSULIN ASPART 100 UNIT/ML ~~LOC~~ SOLN
5.0000 [IU] | Freq: Once | SUBCUTANEOUS | Status: AC
  Administered 2017-09-29: 5 [IU] via INTRAVENOUS

## 2017-09-29 MED ORDER — HYDRALAZINE HCL 20 MG/ML IJ SOLN: 10.0000 mg | Freq: Three times a day (TID) | INTRAMUSCULAR | Status: DC | PRN

## 2017-09-29 MED ORDER — SODIUM CHLORIDE 0.9 % IV BOLUS (SEPSIS): 1000.0000 mL | Freq: Once | INTRAVENOUS | Status: AC

## 2017-09-29 MED ORDER — SODIUM CHLORIDE 0.9 % IV SOLN: INTRAVENOUS | Status: DC

## 2017-09-29 MED ORDER — HYDROMORPHONE HCL 1 MG/ML IJ SOLN: 0.5000 mg | INTRAMUSCULAR | Status: DC | PRN

## 2017-09-29 MED ORDER — SODIUM CHLORIDE 0.9 % IV SOLN: INTRAVENOUS | Status: AC

## 2017-09-29 MED ORDER — SODIUM CHLORIDE 0.9 % IV BOLUS (SEPSIS)
1000.0000 mL | Freq: Once | INTRAVENOUS | Status: AC
  Administered 2017-09-29: 1000 mL via INTRAVENOUS

## 2017-09-29 MED ORDER — ROSUVASTATIN CALCIUM 20 MG PO TABS
40.0000 mg | ORAL_TABLET | Freq: Every day | ORAL | Status: DC
  Administered 2017-09-30 – 2017-10-01 (×2): 40 mg via ORAL
  Filled 2017-09-29 (×2): qty 2
  Filled 2017-09-29: qty 1

## 2017-09-29 MED ORDER — SODIUM CHLORIDE 0.9 % IV SOLN
INTRAVENOUS | Status: DC
  Administered 2017-09-29: 10:00:00 via INTRAVENOUS

## 2017-09-29 MED ORDER — SODIUM CHLORIDE 0.9 % IV BOLUS (SEPSIS)
1000.0000 mL | Freq: Once | INTRAVENOUS | Status: AC
Start: 2017-09-29 — End: 2017-09-29
  Administered 2017-09-29: 1000 mL via INTRAVENOUS

## 2017-09-29 MED ORDER — INSULIN REGULAR HUMAN 100 UNIT/ML IJ SOLN
INTRAMUSCULAR | Status: DC
  Administered 2017-09-29: 3.7 [IU]/h via INTRAVENOUS
  Filled 2017-09-29 (×2): qty 1

## 2017-09-29 MED ORDER — DEXTROSE-NACL 5-0.45 % IV SOLN
INTRAVENOUS | Status: DC
  Administered 2017-09-29: 18:00:00 via INTRAVENOUS

## 2017-09-29 MED ORDER — NITROGLYCERIN 0.4 MG SL SUBL: 0.4000 mg | SUBLINGUAL_TABLET | SUBLINGUAL | Status: DC | PRN

## 2017-09-29 MED ORDER — ONDANSETRON HCL 4 MG/2ML IJ SOLN
4.0000 mg | Freq: Once | INTRAMUSCULAR | Status: AC
  Administered 2017-09-29: 4 mg via INTRAVENOUS
  Filled 2017-09-29: qty 2

## 2017-09-29 MED ORDER — ENOXAPARIN SODIUM 40 MG/0.4ML ~~LOC~~ SOLN
40.0000 mg | SUBCUTANEOUS | Status: DC
  Administered 2017-09-29 – 2017-09-30 (×2): 40 mg via SUBCUTANEOUS
  Filled 2017-09-29 (×2): qty 0.4

## 2017-09-29 MED ORDER — ONDANSETRON HCL 4 MG/2ML IJ SOLN: 4.0000 mg | Freq: Four times a day (QID) | INTRAMUSCULAR | Status: DC | PRN

## 2017-09-29 NOTE — Progress Notes (Signed)
Paged BMET results to Dr. Melynda RippleHobbs.

## 2017-09-29 NOTE — H&P (Signed)
[facility] History and Physical  NYCERE PRESLEY ZOX:[id number] DOB: 03/20/1961 DOA: 09/29/2017  Referring physician:  PCP: Creola Corn, MD   Chief Complaint: high glucose  HPI: Ian Houston is a 56 y.o. male past medical history significant for coronary artery disease, diabetes, hypertension and reflux presents with elevated blood sugars.  Patient checked his blood sugar and it was elevated.  He adjusted his insulin pump and rechecked.  Blood sugar still elevated.  Patient denies any recent illness.  No sick contacts.  No fever or chills.  He does have a chronic cough.  But it has not worsened.  ED course: Blood sugar found to be elevated and patient with labs consistent with DKA.  Started on DKA protocol.  Hospitalist consulted for admission.  Review of Systems:  As per HPI otherwise 10 point review of systems negative.    Past Medical History:  Diagnosis Date  . CAD (coronary artery disease)    a. s/p NSTEMI in the setting of DKA 10/15 >>> LHC (09/29/14):  Mid LAD 90%, proximal D1 90% (small), proximal RCA 20-30%, EF 60% >> PCI:  24 mm x 3.0 Promus Premier DES to LAD.  Marland Kitchen Complication of anesthesia    states difficult to wake up  . Diabetes mellitus without complication (HCC)   . GERD (gastroesophageal reflux disease)   . History of echocardiogram    a.  Echo (10/15):  Vigorous LVF, EF 65-70%, normal wall motion, mild MR  . Hyperlipidemia   . Hypertension   . Tobacco abuse    a. chewing tobacco   Past Surgical History:  Procedure Laterality Date  . ANTERIOR CERVICAL DECOMP/DISCECTOMY FUSION N/A 04/29/2016   Procedure: CERVICAL FOUR-FIVE ANTERIOR CERVICAL DECOMPRESSION/DISKECTOMY/FUSION;  Surgeon: Hilda Lias, MD;  Location: [facility];  Service: Neurosurgery;  Laterality: N/A;  . BACK SURGERY  2011  . LEFT HEART CATHETERIZATION WITH CORONARY ANGIOGRAM N/A 09/29/2014   Procedure: LEFT HEART CATHETERIZATION WITH CORONARY ANGIOGRAM;  Surgeon: Lesleigh Noe, MD;   Location: [facility] CATH LAB;  Service: Cardiovascular;  Laterality: N/A;  . NASAL SEPTUM SURGERY  1998?   Social History:  reports that he has been smoking Cigars.  He has smoked for the past 20.00 years. He has quit using smokeless tobacco. His smokeless tobacco use included Chew. He reports that he drinks about 8.4 oz of alcohol per week . He reports that he does not use drugs.  Allergies  Allergen Reactions  . Sulfa Antibiotics Other (See Comments)    Flu like symptoms    Family History  Problem Relation Age of Onset  . Emphysema Mother   . Leukemia Paternal Grandmother      Prior to Admission medications   Medication Sig Start Date End Date Taking? Authorizing Provider  Insulin Human (INSULIN PUMP) SOLN bolus as directed 4-6 times per day Patient taking differently: bolus as directed 4-6 times per day *Humalog* 09/30/14  Yes Creola Corn, MD  metoprolol tartrate (LOPRESSOR) 25 MG tablet Take 12.5 mg by mouth 2 (two) times daily.   Yes [provider]  nitroGLYCERIN (NITROSTAT) 0.4 MG SL tablet Place 1 tablet (0.4 mg total) under the tongue every 5 (five) minutes as needed for chest pain. 09/30/14  Yes Janetta Hora, PA-C  rosuvastatin (CRESTOR) 40 MG tablet Take 40 mg by mouth daily.   Yes [provider]  atorvastatin (LIPITOR) 80 MG tablet Take 1 tablet (80 mg total) by mouth daily at 6 PM. Patient not taking: Reported on  09/29/2017 09/30/14   Creola Cornusso, John, MD   Physical Exam: Vitals:   09/29/17 1500 09/29/17 1600 09/29/17 1610 09/29/17 1700  BP: (!) 113/53 (!) 146/71  (!) 144/70  Pulse: (!) 102 (!) 106  (!) 109  Resp: 19 16  (!) 26  Temp:   98.6 F (37 C)   TempSrc:   Oral   SpO2: 94% 99%  95%  Weight:      Height:        Wt Readings from Last 3 Encounters:  09/29/17 95.8 kg (211 lb 3.2 oz)  11/09/16 105.4 kg (232 lb 6.4 oz)  04/29/16 108.2 kg (238 lb 9.6 oz)    General:  Appears calm and comfortable; A&Ox3 Eyes:  PERRL, EOMI, normal lids,  iris ENT:  grossly normal hearing, lips & tongue Neck:  no LAD, masses or thyromegaly Cardiovascular:  RRR, no m/r/g. No LE edema.  Respiratory:  CTA bilaterally, no w/r/r. Normal respiratory effort. Abdomen:  soft, ntnd Skin:  no rash or induration seen on limited exam Musculoskeletal:  grossly normal tone BUE/BLE Psychiatric:  grossly normal mood and affect, speech fluent and appropriate Neurologic:  CN 2-12 grossly intact, moves all extremities in coordinated fashion.          Labs on Admission:  Basic Metabolic Panel:  Recent Labs Lab 09/29/17 0804 09/29/17 1151 09/29/17 1616  NA 134* 137 135  K 5.5* 4.8 5.0  CL 96* 104 108  CO2 10* 14* 13*  GLUCOSE 398* 335* 236*  BUN 21* 24* 26*  CREATININE 1.33* 1.37* 1.20  CALCIUM 9.8 9.2 9.3  MG 2.2  --   --   PHOS 5.8*  --   --    Liver Function Tests: No results for input(s): AST, ALT, ALKPHOS, BILITOT, PROT, ALBUMIN in the last 168 hours. No results for input(s): LIPASE, AMYLASE in the last 168 hours. No results for input(s): AMMONIA in the last 168 hours. CBC:  Recent Labs Lab 09/29/17 0804 09/29/17 1152  WBC 23.7* 22.8*  NEUTROABS 21.1*  --   HGB 16.8 15.6  HCT 48.4 46.2  MCV 90.8 90.8  PLT 296 287   Cardiac Enzymes: No results for input(s): CKTOTAL, CKMB, CKMBINDEX, TROPONINI in the last 168 hours.  BNP (last 3 results) No results for input(s): BNP in the last 8760 hours.  ProBNP (last 3 results) No results for input(s): PROBNP in the last 8760 hours.   Serum creatinine: 1.2 mg/dL 16/09/9609/26/18 [id number] Estimated creatinine clearance: 82.5 mL/min  CBG:  Recent Labs Lab 09/29/17 1305 09/29/17 1413 09/29/17 1521 09/29/17 1627 09/29/17 1737  GLUCAP 304* 282* 274* 236* 207*    Radiological Exams on Admission: Dg Chest Port 1 View  Result Date: 09/29/2017 CLINICAL DATA:  Weakness, diabetes EXAM: PORTABLE CHEST 1 VIEW COMPARISON:  08/15/2014 FINDINGS: Minimal bibasilar atelectasis. Heart is normal size. No  effusions. No acute bony abnormality. IMPRESSION: Bibasilar atelectasis. Electronically Signed   By: Charlett NoseKevin  Dover M.D.   On: 09/29/2017 11:00    EKG: no new  Assessment/Plan Active Problems:   DKA (diabetic ketoacidoses) (HCC)   DKA Placed on DKA protocol Unknown cause but due to lack of signs of infection highly suspect equipment failure with the patient's pump  Hypertension When necessary hydralazine 10 mg IV as needed for severe blood pressure  GERD Will monitor for sx  CAD Ntg prn Not on ASA  Code Status: FC DVT Prophylaxis: lovenox Family Communication: wife at bedside Disposition Plan: Pending Improvement  Status: inpt sdu  Elwin Mocha, MD Family Medicine [facility] [email or web address] Password [email or web address]

## 2017-09-29 NOTE — Progress Notes (Addendum)
Inpatient Diabetes Program Recommendations  AACE/ADA: New Consensus Statement on Inpatient Glycemic Control (2015)  Target Ranges:  Prepandial:   less than 140 mg/dL      Peak postprandial:   less than 180 mg/dL (1-2 hours)      Critically ill patients:  140 - 180 mg/dL   Results for Ian Houston, Ian Houston (MRN [id number]) as of 09/29/2017 14:22  Ref. Range 09/29/2017 08:04  Sodium Latest Ref Range: 135 - 145 mmol/L 134 (L)  Potassium Latest Ref Range: 3.5 - 5.1 mmol/L 5.5 (H)  Chloride Latest Ref Range: 101 - 111 mmol/L 96 (L)  CO2 Latest Ref Range: 22 - 32 mmol/L 10 (L)  Glucose Latest Ref Range: 65 - 99 mg/dL 782 (H)  BUN Latest Ref Range: 6 - 20 mg/dL 21 (H)  Creatinine Latest Ref Range: 0.61 - 1.24 mg/dL 9.56 (H)  Calcium Latest Ref Range: 8.9 - 10.3 mg/dL 9.8  Anion gap Latest Ref Range: 5 - 15  28 (H)    Admit with: DKA  History: Diabetes  Home DM Meds: Insulin Pump  Current Insulin Orders: IV Insulin drip      Spoke with pt this afternoon (3:30pm).  Pt told me his CBGs were running a bit higher yesterday during the day (low 200s).  Couldn't get Sensor to calibrate with pump.  Changed set/site yesterday AM and went to work.  Felt bad at work all day.  Bolused several times with pump but CBGs did not reduce.  Came home from work in the afternoon and still felt bad.  Changed set/site again last evening and went to bed with CBG around 220 mg/dl.  Woke up with vomiting and came to ED with glucose >400 mg/dl.  BMET showed DKA.  Started on IV Insulin drip at 10am.  Patient currently on phone with insulin pump company to inquire about a replacement.  Plan to run safety/functionality tests with pump company once wife brings pump back to hospital (took pump home this AM).  Patient sees Dr. Timothy Lasso with [facility] for DM management.  Patient could not tell me off the top of his head what his basal rates are, but will be able to tell the RN once his wife brings the pump back to  the hospital.  Note that 12pm BMET still showed patient with elevated Anion gap and low CO2.  Next BMET pending.     MD- Once patient ready to transition off IV Insulin drip (CO2 at least 20, Anion Gap less than 12), we can do one of the following:  1. If the insulin pump checks out OK, we can have the patient resume his insulin pump with all new supplies (patient aware to use all new supplies).  Patient would restart his pump, we would continue IV Insulin drip for at least 2 hours after pump restarted, and then IV Insulin drip could be stopped.  Patient would need orders for Insulin Pump Order set.  2. If the pump has malfunctioned and patient cannot get a replacement sent overnight, recommend we transition patient to basal/bolus regimen (Lantus and Novolog).  Patient will need to tell the MD how much basal insulin he gets in a 24 hour period on his insulin pump and we can give that basal rate in the form of Lantus SQ.  Would transition off the insulin drip as per normal proceedings- Give basal insulin, wait 2 hours (overlap drip and basal insulin by two hours) and then stop insulin drip.  If pump  is broken, please give Lantus according to how much basal insulin patient gets on insulin pump and patient will also need orders for Novolog Sensitive SSI + Novolog Meal Coverage (could start with Novolog 4 units TID with meals).  If patient's insulin pump is broken and we have to discharge him home on SQ insulin, please make sure to give pt a Rx for Lantus insulin and Syringes.  Patient has Humalog at home that he can use according to how he boluses on his insulin pump.    --Will follow patient during hospitalization--  Ambrose FinlandJeannine Johnston Aariyana Manz RN, MSN, CDE Diabetes Coordinator Inpatient Glycemic Control Team Team Pager: [phone] (8a-5p)

## 2017-09-29 NOTE — ED Provider Notes (Signed)
[facility]-EMERGENCY DEPT Provider Note   CSN: [id number] Arrival date & time: 09/29/17  [id number]     History   Chief Complaint Chief Complaint  Patient presents with  . Hyperglycemia    HPI Ian Houston is a 56 y.o. male.  The history is provided by the patient.  He has a history of diabetes mellitus with episodes of ketoacidosis, currently on an insulin pump. He changed his pump site yesterday. Last night, he noted glucose was going higher than normal-up to 220. Overnight, he started having nausea and vomiting. He has vomited 3 times. Glucose went up to 350. He changed his pump site one more time and came in. This is how he has felt in the past when he has had ketoacidosis. He denies nausea currently. He denies fever or chills. He denies abdominal pain.  Past Medical History:  Diagnosis Date  . CAD (coronary artery disease)    a. s/p NSTEMI in the setting of DKA 10/15 >>> LHC (09/29/14):  Mid LAD 90%, proximal D1 90% (small), proximal RCA 20-30%, EF 60% >> PCI:  24 mm x 3.0 Promus Premier DES to LAD.  Marland Kitchen. Complication of anesthesia    states difficult to wake up  . Diabetes mellitus without complication (HCC)   . GERD (gastroesophageal reflux disease)   . History of echocardiogram    a.  Echo (10/15):  Vigorous LVF, EF 65-70%, normal wall motion, mild MR  . Hyperlipidemia   . Hypertension   . Tobacco abuse    a. chewing tobacco    Patient Active Problem List   Diagnosis Date Noted  . Cervical stenosis of spinal canal 04/29/2016  . 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 Coronary artery disease involving native coronary artery of native heart without angina pectoris 10/08/2014  . Hyperlipidemia 10/08/2014  . Thrush of mouth and esophagus (HCC) 09/28/2014  . Diabetic ketoacidosis associated with type 1 diabetes mellitus (HCC) 09/25/2014  . Dehydration 08/16/2014    Class: Acute    Past Surgical History:    Procedure Laterality Date  . ANTERIOR CERVICAL DECOMP/DISCECTOMY FUSION N/A 04/29/2016   Procedure: CERVICAL FOUR-FIVE ANTERIOR CERVICAL DECOMPRESSION/DISKECTOMY/FUSION;  Surgeon: Hilda LiasErnesto Botero, MD;  Location: [facility]UR[facility] ORS;  Service: Neurosurgery;  Laterality: N/A;  . BACK SURGERY  2011  . LEFT HEART CATHETERIZATION WITH CORONARY ANGIOGRAM N/A 09/29/2014   Procedure: LEFT HEART CATHETERIZATION WITH CORONARY ANGIOGRAM;  Surgeon: Lesleigh NoeHenry W Smith III, MD;  [facility] C[facility] LAB;  Service: Cardiovascular;  Laterality: N/A;  . NASAL SEPTUM SURGERY  1998?       Home Medications    Prior to Admission medications   Medication Sig Start Date End Date Taking? Authorizing Provider  aspirin 81 MG chewable tablet Chew 81 mg by mouth daily.    [provider]  atorvastatin (LIPITOR) 80 MG tablet Take 1 tablet (80 mg total) by mouth daily at 6 PM. 09/30/14   Creola Cornusso, John, MD  Insulin Human (INSULIN PUMP) SOLN bolus as directed 4-6 times per day 09/30/14   Creola Cornusso, John, MD  metoprolol tartrate (LOPRESSOR) 25 MG tablet Take 12.5 mg by mouth 2 (two) times daily.    [provider]  nitroGLYCERIN (NITROSTAT) 0.4 MG SL tablet Place 1 tablet (0.4 mg total) under the tongue every 5 (five) minutes as needed for chest pain. 09/30/14   Janetta Horahompson, Kathryn R, PA-C    Family History Family History  Problem Relation Age of Onset  . Emphysema  Mother   . Leukemia Paternal Grandmother     Social History Social History  Substance Use Topics  . Smoking status: Current Every Day Smoker    Years: 20.00    Types: Cigars  . Smokeless tobacco: Former Neurosurgeon    Types: Chew  . Alcohol use 8.4 oz/week    14 Shots of liquor per week     Comment: daily     Allergies   Sulfa antibiotics   Review of Systems Review of Systems  All other systems reviewed and are negative.    Physical Exam Updated Vital Signs BP (!) 144/72 (BP Location: Right Arm)   Pulse (!) 118   Temp 97.6 F (36.4 C) (Oral)    Resp 18   SpO2 99%   Physical Exam  Nursing note and vitals reviewed.  56 year old male, resting comfortably and in no acute distress. Vital signs are significant for tachycardia and hypertension. Recorded respiratory rate is 18, but on my exam, he is to Make with respiratory rate of 24. Oxygen saturation is 99%, which is normal. No overt clues small respirations. Head is normocephalic and atraumatic. PERRLA, EOMI. Oropharynx is clear. Neck is nontender and supple without adenopathy or JVD. Back is nontender and there is no CVA tenderness. Lungs are clear without rales, wheezes, or rhonchi. Chest is nontender. Heart has regular rate and rhythm without murmur. Abdomen is soft, flat, nontender without masses or hepatosplenomegaly and peristalsis is nhypoactive. Extremities have no cyanosis or edema, full range of motion is present. Skin is warm and dry without rash. Neurologic: Mental status is normal, cranial nerves are intact, there are no motor or sensory deficits.  ED Treatments / Results  Labs (all labs ordered are listed, but only abnormal results are displayed) Labs Reviewed  BASIC METABOLIC PANEL - Abnormal; Notable for the following:       Result Value   Sodium 134 (*)    Potassium 5.5 (*)    Chloride 96 (*)    CO2 10 (*)    Glucose, Bld 398 (*)    BUN 21 (*)    Creatinine, Ser 1.33 (*)    GFR calc non Af Amer 58 (*)    Anion gap 28 (*)    All other components within normal limits  CBC - Abnormal; Notable for the following:    WBC 23.7 (*)    All other components within normal limits  DIFFERENTIAL - Abnormal; Notable for the following:    Neutro Abs 21.1 (*)    Monocytes Absolute 1.2 (*)    All other components within normal limits  BLOOD GAS, VENOUS - Abnormal; Notable for the following:    pH, Ven 7.092 (*)    pCO2, Ven 33.8 (*)    Bicarbonate 9.9 (*)    Acid-base deficit 20.3 (*)    All other components within normal limits  PHOSPHORUS - Abnormal; Notable  for the following:    Phosphorus 5.8 (*)    All other components within normal limits  CBG MONITORING, ED - Abnormal; Notable for the following:    Glucose-Capillary 416 (*)    All other components within normal limits  MAGNESIUM  URINALYSIS, ROUTINE W REFLEX MICROSCOPIC  CBG MONITORING, ED    Procedures Procedures (including critical care time) CRITICAL CARE Performed by: KGMWN,UUVOZ Total critical care time: 50 minutes Critical care time was exclusive of separately billable procedures and treating other patients. Critical care was necessary to treat or prevent imminent or life-threatening deterioration. Critical  care was time spent personally by me on the following activities: development of treatment plan with patient and/or surrogate as well as nursing, discussions with consultants, evaluation of patient's response to treatment, examination of patient, obtaining history from patient or surrogate, ordering and performing treatments and interventions, ordering and review of laboratory studies, ordering and review of radiographic studies, pulse oximetry and re-evaluation of patient's condition.  Medications Ordered in ED Medications  sodium chloride 0.9 % bolus 1,000 mL (not administered)  ondansetron (ZOFRAN) injection 4 mg (not administered)     Initial Impression / Assessment and Plan / ED Course  I have reviewed the triage vital signs and the nursing notes.  Pertinent labs & imaging results that were available during my care of the patient were reviewed by me and considered in my medical decision making (see chart for details).  Nausea and vomiting and hyperglycemia in setting of patient who is diabetic and produced ketoacidosis. Screening labs are obtained and he is given IV fluids and ondansetron. Old records are reviewed, confirming hospitalization for ketoacidosis in 2015.  Labs confirm ketoacidosis. CO2 is 10 with anion gap of 28. PH is 7.092. He is started on an insulin  drip. Case is discussed with Dr. Melynda Ripple of [facility], who agrees to admit the patient.  Final Clinical Impressions(s) / ED Diagnoses   Final diagnoses:  Diabetic ketoacidosis without coma associated with type 1 diabetes mellitus (HCC)    New Prescriptions New Prescriptions   No medications on file     Dione Booze, MD 09/29/17 [phone]

## 2017-09-29 NOTE — ED Notes (Signed)
Patient transported to X-ray 

## 2017-09-29 NOTE — ED Triage Notes (Signed)
Pt complains of weakness, emesis, hyperglycemia since last night. Pt has insulin pump and continuous glucose monitor. Pt's CBG was 355 at 630AM today. Pt gave himself a bolus of insulin around 630AM. Pt has hx of DKA, states he feels similar to last time he had DKA.

## 2017-09-29 NOTE — ED Notes (Signed)
Date and time results received: 09/29/17 0847 Test: Venous gas [facility]  Name of Provider Notified: Dr. Preston FleetingGlick  Orders Received? Or Actions Taken?: No new orders given at this time provider will come to assess patient.

## 2017-09-29 NOTE — ED Notes (Signed)
Hospitalist Provider at bedside. 

## 2017-09-29 NOTE — ED Notes (Signed)
Patient denies pain and is resting comfortably.  

## 2017-09-30 DIAGNOSIS — E1010 Type 1 diabetes mellitus with ketoacidosis without coma: Secondary | ICD-10-CM | POA: Diagnosis not present

## 2017-09-30 DIAGNOSIS — D72829 Elevated white blood cell count, unspecified: Secondary | ICD-10-CM | POA: Diagnosis not present

## 2017-09-30 DIAGNOSIS — I10 Essential (primary) hypertension: Secondary | ICD-10-CM

## 2017-09-30 LAB — GLUCOSE, CAPILLARY
GLUCOSE-CAPILLARY: 132 mg/dL — AB (ref 65–99)
GLUCOSE-CAPILLARY: 322 mg/dL — AB (ref 65–99)
GLUCOSE-CAPILLARY: 227 mg/dL — AB (ref 65–99)
Glucose-Capillary: 127 mg/dL — ABNORMAL HIGH (ref 65–99)
Glucose-Capillary: 165 mg/dL — ABNORMAL HIGH (ref 65–99)
Glucose-Capillary: 215 mg/dL — ABNORMAL HIGH (ref 65–99)
Glucose-Capillary: 240 mg/dL — ABNORMAL HIGH (ref 65–99)
Glucose-Capillary: 272 mg/dL — ABNORMAL HIGH (ref 65–99)

## 2017-09-30 LAB — BLOOD GAS, VENOUS
Acid-base deficit: 4.2 mmol/L — ABNORMAL HIGH (ref 0.0–2.0)
Bicarbonate: 20.1 mmol/L (ref 20.0–28.0)
FIO2: 21
O2 SAT: 93.1 %
PCO2 VEN: 36.1 mmHg — AB (ref 44.0–60.0)
Patient temperature: 98.6
pH, Ven: 7.365 (ref 7.250–7.430)
pO2, Ven: 64 mmHg — ABNORMAL HIGH (ref 32.0–45.0)

## 2017-09-30 LAB — BASIC METABOLIC PANEL
Anion gap: 7 (ref 5–15)
Anion gap: 8 (ref 5–15)
BUN: 22 mg/dL — ABNORMAL HIGH (ref 6–20)
BUN: 22 mg/dL — ABNORMAL HIGH (ref 6–20)
CHLORIDE: 108 mmol/L (ref 101–111)
CHLORIDE: 108 mmol/L (ref 101–111)
CO2: 19 mmol/L — ABNORMAL LOW (ref 22–32)
CO2: 20 mmol/L — ABNORMAL LOW (ref 22–32)
CREATININE: 0.92 mg/dL (ref 0.61–1.24)
Calcium: 9 mg/dL (ref 8.9–10.3)
Calcium: 8.9 mg/dL (ref 8.9–10.3)
Creatinine, Ser: 0.93 mg/dL (ref 0.61–1.24)
Glucose, Bld: 139 mg/dL — ABNORMAL HIGH (ref 65–99)
Glucose, Bld: 152 mg/dL — ABNORMAL HIGH (ref 65–99)
POTASSIUM: 4.2 mmol/L (ref 3.5–5.1)
POTASSIUM: 4 mmol/L (ref 3.5–5.1)
SODIUM: 134 mmol/L — AB (ref 135–145)
SODIUM: 136 mmol/L (ref 135–145)

## 2017-09-30 LAB — HIV ANTIBODY (ROUTINE TESTING W REFLEX): HIV SCREEN 4TH GENERATION: NONREACTIVE

## 2017-09-30 MED ORDER — INSULIN ASPART 100 UNIT/ML ~~LOC~~ SOLN
0.0000 [IU] | SUBCUTANEOUS | Status: DC
  Administered 2017-09-30: 2 [IU] via SUBCUTANEOUS
  Administered 2017-09-30: 8 [IU] via SUBCUTANEOUS
  Administered 2017-09-30: 4 [IU] via SUBCUTANEOUS

## 2017-09-30 MED ORDER — METOPROLOL TARTRATE 12.5 MG HALF TABLET
12.5000 mg | ORAL_TABLET | Freq: Two times a day (BID) | ORAL | Status: DC
  Administered 2017-09-30 – 2017-10-01 (×3): 12.5 mg via ORAL
  Filled 2017-09-30 (×3): qty 1

## 2017-09-30 MED ORDER — INSULIN ASPART 100 UNIT/ML ~~LOC~~ SOLN: 0.0000 [IU] | SUBCUTANEOUS | Status: DC

## 2017-09-30 MED ORDER — INSULIN ASPART 100 UNIT/ML ~~LOC~~ SOLN
4.0000 [IU] | Freq: Three times a day (TID) | SUBCUTANEOUS | Status: DC
  Administered 2017-09-30 – 2017-10-01 (×3): 4 [IU] via SUBCUTANEOUS

## 2017-09-30 MED ORDER — INSULIN GLARGINE 100 UNIT/ML ~~LOC~~ SOLN
5.0000 [IU] | Freq: Every day | SUBCUTANEOUS | Status: DC
  Filled 2017-09-30: qty 0.05

## 2017-09-30 MED ORDER — INSULIN GLARGINE 100 UNIT/ML ~~LOC~~ SOLN
10.0000 [IU] | Freq: Two times a day (BID) | SUBCUTANEOUS | Status: DC
  Administered 2017-09-30 – 2017-10-01 (×3): 10 [IU] via SUBCUTANEOUS
  Filled 2017-09-30 (×4): qty 0.1

## 2017-09-30 MED ORDER — ATORVASTATIN CALCIUM 40 MG PO TABS: 80.0000 mg | ORAL_TABLET | Freq: Every day | ORAL | Status: DC

## 2017-09-30 MED ORDER — INSULIN ASPART 100 UNIT/ML ~~LOC~~ SOLN
0.0000 [IU] | Freq: Three times a day (TID) | SUBCUTANEOUS | Status: DC
  Administered 2017-09-30: 8 [IU] via SUBCUTANEOUS
  Administered 2017-09-30: 11 [IU] via SUBCUTANEOUS
  Administered 2017-10-01: 8 [IU] via SUBCUTANEOUS

## 2017-09-30 MED ORDER — INSULIN ASPART 100 UNIT/ML ~~LOC~~ SOLN
0.0000 [IU] | Freq: Every day | SUBCUTANEOUS | Status: DC
  Administered 2017-09-30: 2 [IU] via SUBCUTANEOUS

## 2017-09-30 NOTE — Progress Notes (Signed)
[facility] PROGRESS NOTE    Progress Note  Ian Houston  [id number] DOB: [birth year] DOA: 09/29/2017 PCP: Creola Cornusso, John, MD     Brief Narrative:   Ian Houston is an 56 y.o. male past medical history of coronary artery sees diabetes mellitus type 2 presents with reflux and hyperglycemia  Assessment/Plan:   DKA (diabetic ketoacidoses) (HCC): I'm unclear etiology, the patient is on insulin pump question of malfunction. Anion gap has closed, his bicarbonate is greater than 20, now off the insulin drip, long-acting insulins are started. We'll start Lantus 5 plus SSI. Patient arranging equipment for pump.  Essential HTN (hypertension): Percent metoprolol and statins.  GERD (gastroesophageal reflux disease) PPI.   DVT prophylaxis: Lovenox Family Communication:none Disposition Plan/Barrier to D/C: transfer to med surg Code Status:     Code Status Orders        Start     Ordered   09/29/17 1013  Full code  Continuous     09/29/17 1016    Code Status History    Date Active Date Inactive Code Status Order ID Comments User Context   04/29/2016  4:24 PM 04/30/2016  3:51 PM Full Code [id number]  Hilda LiasBotero, Ernesto, MD Inpatient   09/29/2014 11:25 AM 09/30/2014  2:18 PM Full Code [id number]  Lyn RecordsSmith, Henry W, MD Inpatient   09/25/2014  9:00 PM 09/29/2014 11:25 AM Full Code [id number]  Duayne CalHoffman, Paul W, NP ED   08/16/2014  1:16 AM 08/16/2014  4:01 PM Full Code [id number]  Jarome MatinPaterson, Daniel, MD Inpatient    Advance Directive Documentation     Most Recent Value  Type of Advance Directive  Healthcare Power of Attorney, Living will  Pre-existing out of facility DNR order (yellow form or pink MOST form)  -  "MOST" Form in Place?  -        IV Access:    Peripheral IV   Procedures and diagnostic studies:   Dg Chest Port 1 View  Result Date: 09/29/2017 CLINICAL DATA:  Weakness, diabetes EXAM: PORTABLE CHEST 1 VIEW COMPARISON:  08/15/2014 FINDINGS: Minimal bibasilar  atelectasis. Heart is normal size. No effusions. No acute bony abnormality. IMPRESSION: Bibasilar atelectasis. Electronically Signed   By: Charlett NoseKevin  Dover M.D.   On: 09/29/2017 11:00     Medical Consultants:    None.  Anti-Infectives:   none  Subjective:    Ian Houston feels great no complains.  Objective:    Vitals:   09/29/17 2200 09/30/17 0000 09/30/17 0002 09/30/17 0400  BP: (!) 121/58 140/70  (!) 143/77  Pulse: 92 88  85  Resp: 18 16  18   Temp:   98.8 F (37.1 C) 99.1 F (37.3 C)  TempSrc:   Oral Oral  SpO2: 96% 95%  95%  Weight:      Height:        Intake/Output Summary (Last 24 hours) at 09/30/17 0717 Last data filed at 09/30/17 0220  Gross per 24 hour  Intake          1965.09 ml  Output                0 ml  Net          1965.09 ml   Filed Weights   09/29/17 0949 09/29/17 1100  Weight: 102.1 kg (225 lb) 95.8 kg (211 lb 3.2 oz)    Exam: General exam: In no acute distress. Respiratory system: Good air movement and clear to auscultation. Cardiovascular system: S1 & S2 heard, RRR.  Gastrointestinal system: Abdomen is nondistended, soft and nontender.  Central nervous system: Alert and oriented. No focal neurological deficits. Extremities: No pedal edema. Skin: No rashes, lesions or ulcers Psychiatry: Judgement and insight appear normal. Mood & affect appropriate.    Data Reviewed:    Labs: Basic Metabolic Panel:  Recent Labs Lab 09/29/17 0804 09/29/17 1151 09/29/17 1616 09/29/17 2011 09/30/17 0049 09/30/17 0328  NA 134* 137 135 135 134* 136  K 5.5* 4.8 5.0 4.4 4.2 4.0  CL 96* 104 108 109 108 108  CO2 10* 14* 13* 19* 19* 20*  GLUCOSE 398* 335* 236* 172* 139* 152*  BUN 21* 24* 26* 26* 22* 22*  CREATININE 1.33* 1.37* 1.20 1.02 0.93 0.92  CALCIUM 9.8 9.2 9.3 9.5 9.0 8.9  MG 2.2  --   --   --   --   --   PHOS 5.8*  --   --   --   --   --    GFR Estimated Creatinine Clearance: 107.7 mL/min (by C-G formula based on SCr of 0.92  mg/dL). Liver Function Tests: No results for input(s): AST, ALT, ALKPHOS, BILITOT, PROT, ALBUMIN in the last 168 hours. No results for input(s): LIPASE, AMYLASE in the last 168 hours. No results for input(s): AMMONIA in the last 168 hours. Coagulation profile No results for input(s): INR, PROTIME in the last 168 hours.  CBC:  Recent Labs Lab 09/29/17 0804 09/29/17 1152  WBC 23.7* 22.8*  NEUTROABS 21.1*  --   HGB 16.8 15.6  HCT 48.4 46.2  MCV 90.8 90.8  PLT 296 287   Cardiac Enzymes: No results for input(s): CKTOTAL, CKMB, CKMBINDEX, TROPONINI in the last 168 hours. BNP (last 3 results) No results for input(s): PROBNP in the last 8760 hours. CBG:  Recent Labs Lab 09/29/17 2221 09/29/17 2332 09/30/17 0039 09/30/17 0145 09/30/17 0427  GLUCAP 139* 128* 127* 165* 132*   D-Dimer: No results for input(s): DDIMER in the last 72 hours. Hgb A1c: No results for input(s): HGBA1C in the last 72 hours. Lipid Profile: No results for input(s): CHOL, HDL, LDLCALC, TRIG, CHOLHDL, LDLDIRECT in the last 72 hours. Thyroid function studies: No results for input(s): TSH, T4TOTAL, T3FREE, THYROIDAB in the last 72 hours.  Invalid input(s): FREET3 Anemia work up: No results for input(s): VITAMINB12, FOLATE, FERRITIN, TIBC, IRON, RETICCTPCT in the last 72 hours. Sepsis Labs:  Recent Labs Lab 09/29/17 0804 09/29/17 1152  WBC 23.7* 22.8*   Microbiology Recent Results (from the past 240 hour(s))  MRSA PCR Screening     Status: None   Collection Time: 09/29/17 11:54 AM  Result Value Ref Range Status   MRSA by PCR NEGATIVE NEGATIVE Final    Comment:        The GeneXpert MRSA Assay (FDA approved for NASAL specimens only), is one component of a comprehensive MRSA colonization surveillance program. It is not intended to diagnose MRSA infection nor to guide or monitor treatment for MRSA infections.      Medications:   . enoxaparin (LOVENOX) injection  40 mg Subcutaneous Q24H   . insulin aspart  0-24 Units Subcutaneous Q4H  . rosuvastatin  40 mg Oral Daily   Continuous Infusions: . sodium chloride Stopped (09/29/17 1840)      LOS: 1 day   Marinda Elk  [facility] Pager [phone]  *Please refer to [email or web address], password [email or web address] to get updated schedule on who will round on this patient, as hospitalists switch teams weekly. If 7PM-7AM, please contact night-coverage  at [email or web address], password [email or web address] for any overnight needs.  09/30/2017, 7:17 AM

## 2017-10-01 DIAGNOSIS — E1010 Type 1 diabetes mellitus with ketoacidosis without coma: Secondary | ICD-10-CM | POA: Diagnosis not present

## 2017-10-01 DIAGNOSIS — I10 Essential (primary) hypertension: Secondary | ICD-10-CM | POA: Diagnosis not present

## 2017-10-01 LAB — GLUCOSE, CAPILLARY: GLUCOSE-CAPILLARY: 281 mg/dL — AB (ref 65–99)

## 2017-10-01 MED ORDER — INSULIN GLARGINE 100 UNIT/ML ~~LOC~~ SOLN
17.0000 [IU] | Freq: Two times a day (BID) | SUBCUTANEOUS | Status: DC
  Filled 2017-10-01: qty 0.17

## 2017-10-01 NOTE — Progress Notes (Signed)
Assessment unchanged. Pt verbalized understanding of dc instructions through teach back including follow up w/ PCP as well as management of insulin pump at home per Dr. Katherine MantleFelix-Ortiz instructions. Discharged via wc to front entrance accompanied by wife and NT.

## 2017-10-01 NOTE — Discharge Summary (Signed)
Physician Discharge Summary  Ian Houston [id number] DOB: 07-02-1961 DOA: 09/29/2017  PCP: Creola Cornusso, John, MD  Admit date: 09/29/2017 Discharge date: 10/01/2017  Admitted From: home Disposition:  Home  Recommendations for Outpatient Follow-up:  1. None  Home Health:no Equipment/Devices:none  Discharge Condition:stable CODE STATUS:full Diet recommendation: Heart Healthy  Brief/Interim Summary: 56 y.o. male past medical history of coronary artery sees diabetes mellitus type 2 presents with reflux and hyperglycemia  Discharge Diagnoses:  Principal Problem:   DKA (diabetic ketoacidoses) (HCC) Active Problems:   HTN (hypertension)   GERD (gastroesophageal reflux disease)  DKA: Likely due to defective insulin pump, on admission he was started on IV insulin and IV fluids. His anion gap closed quickly, his bicarbonate was greater than 20 he was switched to long-acting insulin plus sliding scale. He relates that after he did the multiple checks on his insulin pump he relates that was probably the sensor which was not working. He will can go home on on insulin, he has already called his bipolar and he will have anotherinsulin pump on 10/02/2017. All other medical problems are stable and no changes were made.  Discharge Instructions  Discharge Instructions    Diet - low sodium heart healthy    Complete by:  As directed    Increase activity slowly    Complete by:  As directed      Allergies as of 10/01/2017      Reactions   Sulfa Antibiotics Other (See Comments)   Flu like symptoms      Medication List    TAKE these medications   atorvastatin 80 MG tablet Commonly known as:  LIPITOR Take 1 tablet (80 mg total) by mouth daily at 6 PM.   insulin pump Soln bolus as directed 4-6 times per day What changed:  additional instructions   metoprolol tartrate 25 MG tablet Commonly known as:  LOPRESSOR Take 12.5 mg by mouth 2 (two) times daily.   nitroGLYCERIN 0.4 MG SL  tablet Commonly known as:  NITROSTAT Place 1 tablet (0.4 mg total) under the tongue every 5 (five) minutes as needed for chest pain.   rosuvastatin 40 MG tablet Commonly known as:  CRESTOR Take 40 mg by mouth daily.       Allergies  Allergen Reactions  . Sulfa Antibiotics Other (See Comments)    Flu like symptoms    Consultations:  None   Procedures/Studies: Dg Chest Port 1 View  Result Date: 09/29/2017 CLINICAL DATA:  Weakness, diabetes EXAM: PORTABLE CHEST 1 VIEW COMPARISON:  08/15/2014 FINDINGS: Minimal bibasilar atelectasis. Heart is normal size. No effusions. No acute bony abnormality. IMPRESSION: Bibasilar atelectasis. Electronically Signed   By: Charlett NoseKevin  Dover M.D.   On: 09/29/2017 11:00     Subjective: no complaints.  Discharge Exam: Vitals:   09/30/17 2100 10/01/17 0510  BP: 130/78 135/81  Pulse: 63 63  Resp: 18 18  Temp: 98.9 F (37.2 C) 98.7 F (37.1 C)  SpO2: 96% 97%   Vitals:   09/30/17 1210 09/30/17 1800 09/30/17 2100 10/01/17 0510  BP:  126/77 130/78 135/81  Pulse:  65 63 63  Resp:  (!) 21 18 18   Temp: 98.1 F (36.7 C) 98.4 F (36.9 C) 98.9 F (37.2 C) 98.7 F (37.1 C)  TempSrc: Oral Oral Oral Oral  SpO2:  95% 96% 97%  Weight:      Height:        General: Pt is alert, awake, not in acute distress Cardiovascular: RRR, S1/S2 +, no rubs,  no gallops Respiratory: CTA bilaterally, no wheezing, no rhonchi Abdominal: Soft, NT, ND, bowel sounds + Extremities: no edema, no cyanosis    The results of significant diagnostics from this hospitalization (including imaging, microbiology, ancillary and laboratory) are listed below for reference.     Microbiology: Recent Results (from the past 240 hour(s))  MRSA PCR Screening     Status: None   Collection Time: 09/29/17 11:54 AM  Result Value Ref Range Status   MRSA by PCR NEGATIVE NEGATIVE Final    Comment:        The GeneXpert MRSA Assay (FDA approved for NASAL specimens only), is one  component of a comprehensive MRSA colonization surveillance program. It is not intended to diagnose MRSA infection nor to guide or monitor treatment for MRSA infections.      Labs: BNP (last 3 results) No results for input(s): BNP in the last 8760 hours. Basic Metabolic Panel:  Recent Labs Lab 09/29/17 0804 09/29/17 1151 09/29/17 1616 09/29/17 2011 09/30/17 0049 09/30/17 0328  NA 134* 137 135 135 134* 136  K 5.5* 4.8 5.0 4.4 4.2 4.0  CL 96* 104 108 109 108 108  CO2 10* 14* 13* 19* 19* 20*  GLUCOSE 398* 335* 236* 172* 139* 152*  BUN 21* 24* 26* 26* 22* 22*  CREATININE 1.33* 1.37* 1.20 1.02 0.93 0.92  CALCIUM 9.8 9.2 9.3 9.5 9.0 8.9  MG 2.2  --   --   --   --   --   PHOS 5.8*  --   --   --   --   --    Liver Function Tests: No results for input(s): AST, ALT, ALKPHOS, BILITOT, PROT, ALBUMIN in the last 168 hours. No results for input(s): LIPASE, AMYLASE in the last 168 hours. No results for input(s): AMMONIA in the last 168 hours. CBC:  Recent Labs Lab 09/29/17 0804 09/29/17 1152  WBC 23.7* 22.8*  NEUTROABS 21.1*  --   HGB 16.8 15.6  HCT 48.4 46.2  MCV 90.8 90.8  PLT 296 287   Cardiac Enzymes: No results for input(s): CKTOTAL, CKMB, CKMBINDEX, TROPONINI in the last 168 hours. BNP: Invalid input(s): POCBNP CBG:  Recent Labs Lab 09/30/17 1212 09/30/17 1551 09/30/17 1949 09/30/17 2213 10/01/17 0813  GLUCAP 272* 322* 240* 227* 281*   D-Dimer No results for input(s): DDIMER in the last 72 hours. Hgb A1c No results for input(s): HGBA1C in the last 72 hours. Lipid Profile No results for input(s): CHOL, HDL, LDLCALC, TRIG, CHOLHDL, LDLDIRECT in the last 72 hours. Thyroid function studies No results for input(s): TSH, T4TOTAL, T3FREE, THYROIDAB in the last 72 hours.  Invalid input(s): FREET3 Anemia work up No results for input(s): VITAMINB12, FOLATE, FERRITIN, TIBC, IRON, RETICCTPCT in the last 72 hours. Urinalysis    Component Value Date/Time    COLORURINE STRAW (A) 09/29/2017 0815   APPEARANCEUR CLEAR 09/29/2017 0815   LABSPEC 1.021 09/29/2017 0815   PHURINE 5.0 09/29/2017 0815   GLUCOSEU >=500 (A) 09/29/2017 0815   HGBUR SMALL (A) 09/29/2017 0815   BILIRUBINUR NEGATIVE 09/29/2017 0815   KETONESUR 80 (A) 09/29/2017 0815   PROTEINUR 30 (A) 09/29/2017 0815   UROBILINOGEN 0.2 09/25/2014 2337   NITRITE NEGATIVE 09/29/2017 0815   LEUKOCYTESUR NEGATIVE 09/29/2017 0815   Sepsis Labs Invalid input(s): PROCALCITONIN,  WBC,  LACTICIDVEN Microbiology Recent Results (from the past 240 hour(s))  MRSA PCR Screening     Status: None   Collection Time: 09/29/17 11:54 AM  Result Value Ref Range Status  MRSA by PCR NEGATIVE NEGATIVE Final    Comment:        The GeneXpert MRSA Assay (FDA approved for NASAL specimens only), is one component of a comprehensive MRSA colonization surveillance program. It is not intended to diagnose MRSA infection nor to guide or monitor treatment for MRSA infections.      Time coordinating discharge: Over 30 minutes  SIGNED:   Marinda Elk, MD  [facility] 10/01/2017, 10:06 AM Pager   If 7PM-7AM, please contact night-coverage [email or web address] Password [email or web address]

## 2017-10-10 DIAGNOSIS — Z794 Long term (current) use of insulin: Secondary | ICD-10-CM | POA: Diagnosis not present

## 2017-10-10 DIAGNOSIS — E1065 Type 1 diabetes mellitus with hyperglycemia: Secondary | ICD-10-CM | POA: Diagnosis not present

## 2017-10-10 DIAGNOSIS — I2510 Atherosclerotic heart disease of native coronary artery without angina pectoris: Secondary | ICD-10-CM | POA: Diagnosis not present

## 2017-10-13 DIAGNOSIS — Z794 Long term (current) use of insulin: Secondary | ICD-10-CM | POA: Diagnosis not present

## 2017-10-13 DIAGNOSIS — E109 Type 1 diabetes mellitus without complications: Secondary | ICD-10-CM | POA: Diagnosis not present

## 2017-10-13 DIAGNOSIS — E1065 Type 1 diabetes mellitus with hyperglycemia: Secondary | ICD-10-CM | POA: Diagnosis not present

## 2017-11-13 ENCOUNTER — Ambulatory Visit: Payer: 59 | Admitting: Cardiology

## 2017-11-21 DIAGNOSIS — Z794 Long term (current) use of insulin: Secondary | ICD-10-CM | POA: Diagnosis not present

## 2017-11-21 DIAGNOSIS — E1065 Type 1 diabetes mellitus with hyperglycemia: Secondary | ICD-10-CM | POA: Diagnosis not present

## 2017-11-21 DIAGNOSIS — Z4681 Encounter for fitting and adjustment of insulin pump: Secondary | ICD-10-CM | POA: Diagnosis not present

## 2017-12-11 DIAGNOSIS — Z794 Long term (current) use of insulin: Secondary | ICD-10-CM | POA: Diagnosis not present

## 2017-12-11 DIAGNOSIS — E1065 Type 1 diabetes mellitus with hyperglycemia: Secondary | ICD-10-CM | POA: Diagnosis not present

## 2017-12-11 DIAGNOSIS — E109 Type 1 diabetes mellitus without complications: Secondary | ICD-10-CM | POA: Diagnosis not present

## 2017-12-28 DIAGNOSIS — Z125 Encounter for screening for malignant neoplasm of prostate: Secondary | ICD-10-CM | POA: Diagnosis not present

## 2017-12-28 DIAGNOSIS — R82998 Other abnormal findings in urine: Secondary | ICD-10-CM | POA: Diagnosis not present

## 2017-12-28 DIAGNOSIS — Z0000 Encounter for general adult medical examination without abnormal findings: Secondary | ICD-10-CM | POA: Diagnosis not present

## 2017-12-29 ENCOUNTER — Ambulatory Visit: Payer: 59 | Admitting: Cardiology

## 2017-12-29 ENCOUNTER — Encounter: Payer: Self-pay | Admitting: Cardiology

## 2017-12-29 VITALS — BP 142/90 | HR 66 | Ht 72.0 in | Wt 226.0 lb

## 2017-12-29 DIAGNOSIS — E7800 Pure hypercholesterolemia, unspecified: Secondary | ICD-10-CM

## 2017-12-29 DIAGNOSIS — I10 Essential (primary) hypertension: Secondary | ICD-10-CM

## 2017-12-29 DIAGNOSIS — I2510 Atherosclerotic heart disease of native coronary artery without angina pectoris: Secondary | ICD-10-CM

## 2017-12-29 NOTE — Progress Notes (Signed)
Cardiology Office Note   Date:  12/29/2017   ID:  Ian RudeCharles G Lindamood, DOB 1961-05-12, MRN [id number]  PCP:  Creola Cornusso, John, MD  Cardiologist:  Dr. Donato SchultzMark Skains    History of Present Illness: Ian Houston is a 57 y.o. male with a hx of T1DM and former smoker, no longer dipping.  He was admitted 10/22-10/27/15 with DKA and hyperkalemia in the setting of insulin pump failure.  Initial EKG demonstrated lateral ST changes initially thought to be related to metabolic derangement.  However, he subsequently ruled in for NSTEMI (peak Tn 7.5).  He was taken for cardiac catheterization. This demonstrated severe stenosis in the mid LAD as well as high-grade stenosis in a small D1. LAD was treated with a DES. Ejection fraction remained preserved.   He was on DAPT x 1 year (stopped in Oct 2016) and remains on ASA daily.  He returns for follow-up.  He is doing well. He denies chest discomfort, shortness of breath, syncope, orthopnea, PND or edema.  He reports snoring and has known diagnosis of OSA for which he uses an oral appliance.  Had neck surgery in 2011 and 2017. 7 weeks of neck immobilization. Occasionally he will have sharp fleeting pinpoint chest discomfort. Atypical. Noncardiac. 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  Studies:  - LHC (09/29/14):  Mid LAD 90%, proximal D1 90% (small), proximal RCA 20-30%, EF 60% >> PCI:  24 mm x 3.0 Promus Premier DES to LAD.  - Echo (10/15):  Vigorous LVF, EF 65-70%, normal wall motion, mild MR   Recent Labs/Images:  Wt Readings from Last 3 Encounters:  12/29/17 226 lb (102.5 kg)  09/29/17 211 lb 3.2 oz (95.8 kg)  11/09/16 232 lb 6.4 oz (105.4 kg)     Past Medical History:  Diagnosis Date  . CAD (coronary artery disease)    a. s/p NSTEMI in the setting of DKA 10/15 >>> LHC (09/29/14):  Mid LAD 90%, proximal D1 90% (small), proximal RCA 20-30%, EF 60% >> PCI:  24 mm x 3.0 Promus Premier DES to LAD.  Marland Kitchen.  Complication of anesthesia    states difficult to wake up  . Diabetes mellitus without complication (HCC)   . GERD (gastroesophageal reflux disease)   . History of echocardiogram    a.  Echo (10/15):  Vigorous LVF, EF 65-70%, normal wall motion, mild MR  . Hyperlipidemia   . Hypertension   . Tobacco abuse    a. chewing tobacco    Current Outpatient Medications  Medication Sig Dispense Refill  . Insulin Human (INSULIN PUMP) SOLN bolus as directed 4-6 times per day (Patient taking differently: bolus as directed 4-6 times per day *Humalog*)    . nitroGLYCERIN (NITROSTAT) 0.4 MG SL tablet Place 1 tablet (0.4 mg total) under the tongue every 5 (five) minutes as needed for chest pain. 25 tablet 12  . ONE TOUCH ULTRA TEST test strip CHECK BLOOD SUGAR 3X A DAY. DX CODE-E10.65  5  . rosuvastatin (CRESTOR) 40 MG tablet Take 40 mg by mouth daily.     No current facility-administered medications for this visit.      Allergies:   Sulfa antibiotics   Social History:  The patient  reports that he has been smoking cigars.  He has smoked for the past 20.00 years. He has quit using smokeless tobacco. His smokeless tobacco use included chew. He reports that he drinks about 8.4 oz of alcohol per week. He reports  that he does not use drugs.   Family History:  The patient's family history includes Emphysema in his mother; Leukemia in his paternal grandmother.   ROS:  Please see the history of present illness. + snoring.  All other systems reviewed and negative.   PHYSICAL EXAM: VS:  BP (!) 142/90   Pulse 66   Ht 6' (1.829 m)   Wt 226 lb (102.5 kg)   SpO2 97%   BMI 30.65 kg/m  GEN: Well nourished, well developed, in no acute distress , GTCC uniform HEENT: normal  Neck: no JVD, carotid bruits, or masses Cardiac: RRR; no murmurs, rubs, or gallops,no edema  Respiratory:  clear to auscultation bilaterally, normal work of breathing GI: soft, nontender, nondistended, + BS MS: no deformity or atrophy    Skin: warm and dry, no rash Neuro:  Alert and Oriented x 3, Strength and sensation are intact Psych: euthymic mood, full affect   EKG:  Today 12/29/17 - NSR no other changes. Personally viewed. 11/05/15 - NSR, HR 67, normal axis, no ST changes     Echocardiogram: 09/26/14 - EF 65-70%  ASSESSMENT AND PLAN:  1.  Coronary artery disease:  Doing well after non-STEMI in the setting of DKA treated with a DES to the LAD 09/29/14.  Was on DAPT x 1 year (stopped in Oct 2016).   Sometimes feels abdominal bloating low abd, consistent through day. Does not sound cardiac.    -  Continue with ASA, high dose statin. Has been feeling some fatigue, "legs feel weak at times". I will stop metoprolol 12.5 BID. It has been over 3 years since NSTEMI. Let's see if this helps with fatigue. If BP increases on avg greater than 130/80 - will need lisinopril.     2.  Hyperlipidemia:  Managed by Dr. Timothy Lasso. Goal LDL less than 70. Doing well, no adverse drug reaction.  On high-dose statin. States that he was taking crestor during last check in 02/2017 - 130.   3.  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  Recent A1c 7.  Aim for good control.  Follow-up by Dr. Timothy Lasso.  4. Cervical spine disc injury- followed by Dr. Jeral Fruit. Successful second surgery in 2017. No change   5. Former tobacco user-smoked several years ago.  Reported occasional cigar use.  Counseled to decrease cigar use. No change  Disposition:   FU in 1 year.   Signed Donato Schultz, MD 12/29/2017 4:48 PM    [facility] [address] [place], [place], [place]  [address] Phone: [phone]; Fax: [phone]

## 2017-12-29 NOTE — Patient Instructions (Signed)
Medication Instructions:  Please discontinue your Metoprolol 12.5 mg twice a day. Continue all other medications as listed.  If blood pressure is consistently elevated above 130/80, please contact us.  Follow-Up: Follow up in 1 year with Dr. Anne FuSkains.  You will receive a letter in the mail 2 months before you are due.  Please call us when you receive this letter to schedule your follow up appointment.  If you need a refill on your cardiac medications before your next appointment, please call your pharmacy.  Thank you for choosing  [facility]!!

## 2018-01-04 DIAGNOSIS — Z23 Encounter for immunization: Secondary | ICD-10-CM | POA: Diagnosis not present

## 2018-01-04 DIAGNOSIS — H268 Other specified cataract: Secondary | ICD-10-CM | POA: Diagnosis not present

## 2018-01-04 DIAGNOSIS — Z0000 Encounter for general adult medical examination without abnormal findings: Secondary | ICD-10-CM | POA: Diagnosis not present

## 2018-01-04 DIAGNOSIS — E1065 Type 1 diabetes mellitus with hyperglycemia: Secondary | ICD-10-CM | POA: Diagnosis not present

## 2018-01-04 DIAGNOSIS — Z1389 Encounter for screening for other disorder: Secondary | ICD-10-CM | POA: Diagnosis not present

## 2018-01-04 DIAGNOSIS — I119 Hypertensive heart disease without heart failure: Secondary | ICD-10-CM | POA: Diagnosis not present

## 2018-01-17 DIAGNOSIS — Z794 Long term (current) use of insulin: Secondary | ICD-10-CM | POA: Diagnosis not present

## 2018-01-17 DIAGNOSIS — Z4681 Encounter for fitting and adjustment of insulin pump: Secondary | ICD-10-CM | POA: Diagnosis not present

## 2018-01-17 DIAGNOSIS — E1065 Type 1 diabetes mellitus with hyperglycemia: Secondary | ICD-10-CM | POA: Diagnosis not present

## 2018-02-19 DIAGNOSIS — Z86010 Personal history of colonic polyps: Secondary | ICD-10-CM | POA: Diagnosis not present

## 2018-02-19 DIAGNOSIS — Z794 Long term (current) use of insulin: Secondary | ICD-10-CM | POA: Diagnosis not present

## 2018-02-19 DIAGNOSIS — E1065 Type 1 diabetes mellitus with hyperglycemia: Secondary | ICD-10-CM | POA: Diagnosis not present

## 2018-02-19 DIAGNOSIS — E109 Type 1 diabetes mellitus without complications: Secondary | ICD-10-CM | POA: Diagnosis not present

## 2018-03-06 DIAGNOSIS — Z794 Long term (current) use of insulin: Secondary | ICD-10-CM | POA: Diagnosis not present

## 2018-03-06 DIAGNOSIS — E1065 Type 1 diabetes mellitus with hyperglycemia: Secondary | ICD-10-CM | POA: Diagnosis not present

## 2018-03-06 DIAGNOSIS — E109 Type 1 diabetes mellitus without complications: Secondary | ICD-10-CM | POA: Diagnosis not present

## 2018-05-11 DIAGNOSIS — G250 Essential tremor: Secondary | ICD-10-CM | POA: Diagnosis not present

## 2018-05-11 DIAGNOSIS — I119 Hypertensive heart disease without heart failure: Secondary | ICD-10-CM | POA: Diagnosis not present

## 2018-05-11 DIAGNOSIS — E1065 Type 1 diabetes mellitus with hyperglycemia: Secondary | ICD-10-CM | POA: Diagnosis not present

## 2018-05-17 DIAGNOSIS — Z794 Long term (current) use of insulin: Secondary | ICD-10-CM | POA: Diagnosis not present

## 2018-05-17 DIAGNOSIS — E109 Type 1 diabetes mellitus without complications: Secondary | ICD-10-CM | POA: Diagnosis not present

## 2018-05-17 DIAGNOSIS — E1065 Type 1 diabetes mellitus with hyperglycemia: Secondary | ICD-10-CM | POA: Diagnosis not present

## 2018-06-13 DIAGNOSIS — E109 Type 1 diabetes mellitus without complications: Secondary | ICD-10-CM | POA: Diagnosis not present

## 2018-06-13 DIAGNOSIS — Z794 Long term (current) use of insulin: Secondary | ICD-10-CM | POA: Diagnosis not present

## 2018-06-13 DIAGNOSIS — E1065 Type 1 diabetes mellitus with hyperglycemia: Secondary | ICD-10-CM | POA: Diagnosis not present

## 2018-08-17 DIAGNOSIS — E109 Type 1 diabetes mellitus without complications: Secondary | ICD-10-CM | POA: Diagnosis not present

## 2018-08-17 DIAGNOSIS — H2513 Age-related nuclear cataract, bilateral: Secondary | ICD-10-CM | POA: Diagnosis not present

## 2018-08-17 DIAGNOSIS — Z794 Long term (current) use of insulin: Secondary | ICD-10-CM | POA: Diagnosis not present

## 2018-08-17 DIAGNOSIS — H25041 Posterior subcapsular polar age-related cataract, right eye: Secondary | ICD-10-CM | POA: Diagnosis not present

## 2018-08-17 DIAGNOSIS — E1065 Type 1 diabetes mellitus with hyperglycemia: Secondary | ICD-10-CM | POA: Diagnosis not present

## 2018-08-17 DIAGNOSIS — H25013 Cortical age-related cataract, bilateral: Secondary | ICD-10-CM | POA: Diagnosis not present

## 2018-09-03 DIAGNOSIS — H25811 Combined forms of age-related cataract, right eye: Secondary | ICD-10-CM | POA: Diagnosis not present

## 2018-09-03 DIAGNOSIS — H2511 Age-related nuclear cataract, right eye: Secondary | ICD-10-CM | POA: Diagnosis not present

## 2018-09-03 DIAGNOSIS — H25041 Posterior subcapsular polar age-related cataract, right eye: Secondary | ICD-10-CM | POA: Diagnosis not present

## 2018-09-03 DIAGNOSIS — H25011 Cortical age-related cataract, right eye: Secondary | ICD-10-CM | POA: Diagnosis not present

## 2018-09-06 DIAGNOSIS — G250 Essential tremor: Secondary | ICD-10-CM | POA: Diagnosis not present

## 2018-09-06 DIAGNOSIS — E1065 Type 1 diabetes mellitus with hyperglycemia: Secondary | ICD-10-CM | POA: Diagnosis not present

## 2018-09-06 DIAGNOSIS — Z1389 Encounter for screening for other disorder: Secondary | ICD-10-CM | POA: Diagnosis not present

## 2018-09-06 DIAGNOSIS — I119 Hypertensive heart disease without heart failure: Secondary | ICD-10-CM | POA: Diagnosis not present

## 2018-09-13 DIAGNOSIS — Z794 Long term (current) use of insulin: Secondary | ICD-10-CM | POA: Diagnosis not present

## 2018-09-13 DIAGNOSIS — E109 Type 1 diabetes mellitus without complications: Secondary | ICD-10-CM | POA: Diagnosis not present

## 2018-09-13 DIAGNOSIS — E1065 Type 1 diabetes mellitus with hyperglycemia: Secondary | ICD-10-CM | POA: Diagnosis not present

## 2018-10-09 DIAGNOSIS — H2512 Age-related nuclear cataract, left eye: Secondary | ICD-10-CM | POA: Diagnosis not present

## 2018-10-09 DIAGNOSIS — H25812 Combined forms of age-related cataract, left eye: Secondary | ICD-10-CM | POA: Diagnosis not present

## 2018-10-09 DIAGNOSIS — H25012 Cortical age-related cataract, left eye: Secondary | ICD-10-CM | POA: Diagnosis not present

## 2018-11-16 DIAGNOSIS — E1065 Type 1 diabetes mellitus with hyperglycemia: Secondary | ICD-10-CM | POA: Diagnosis not present

## 2018-11-16 DIAGNOSIS — Z794 Long term (current) use of insulin: Secondary | ICD-10-CM | POA: Diagnosis not present

## 2018-11-16 DIAGNOSIS — E109 Type 1 diabetes mellitus without complications: Secondary | ICD-10-CM | POA: Diagnosis not present

## 2018-11-20 IMAGING — DX DG CHEST 1V PORT
1 series · 1 of 1 positions shown · non-contrast
Comparison: 08/15/2014

CLINICAL DATA: Weakness, diabetes

EXAM:
PORTABLE CHEST 1 VIEW

[chest ap]
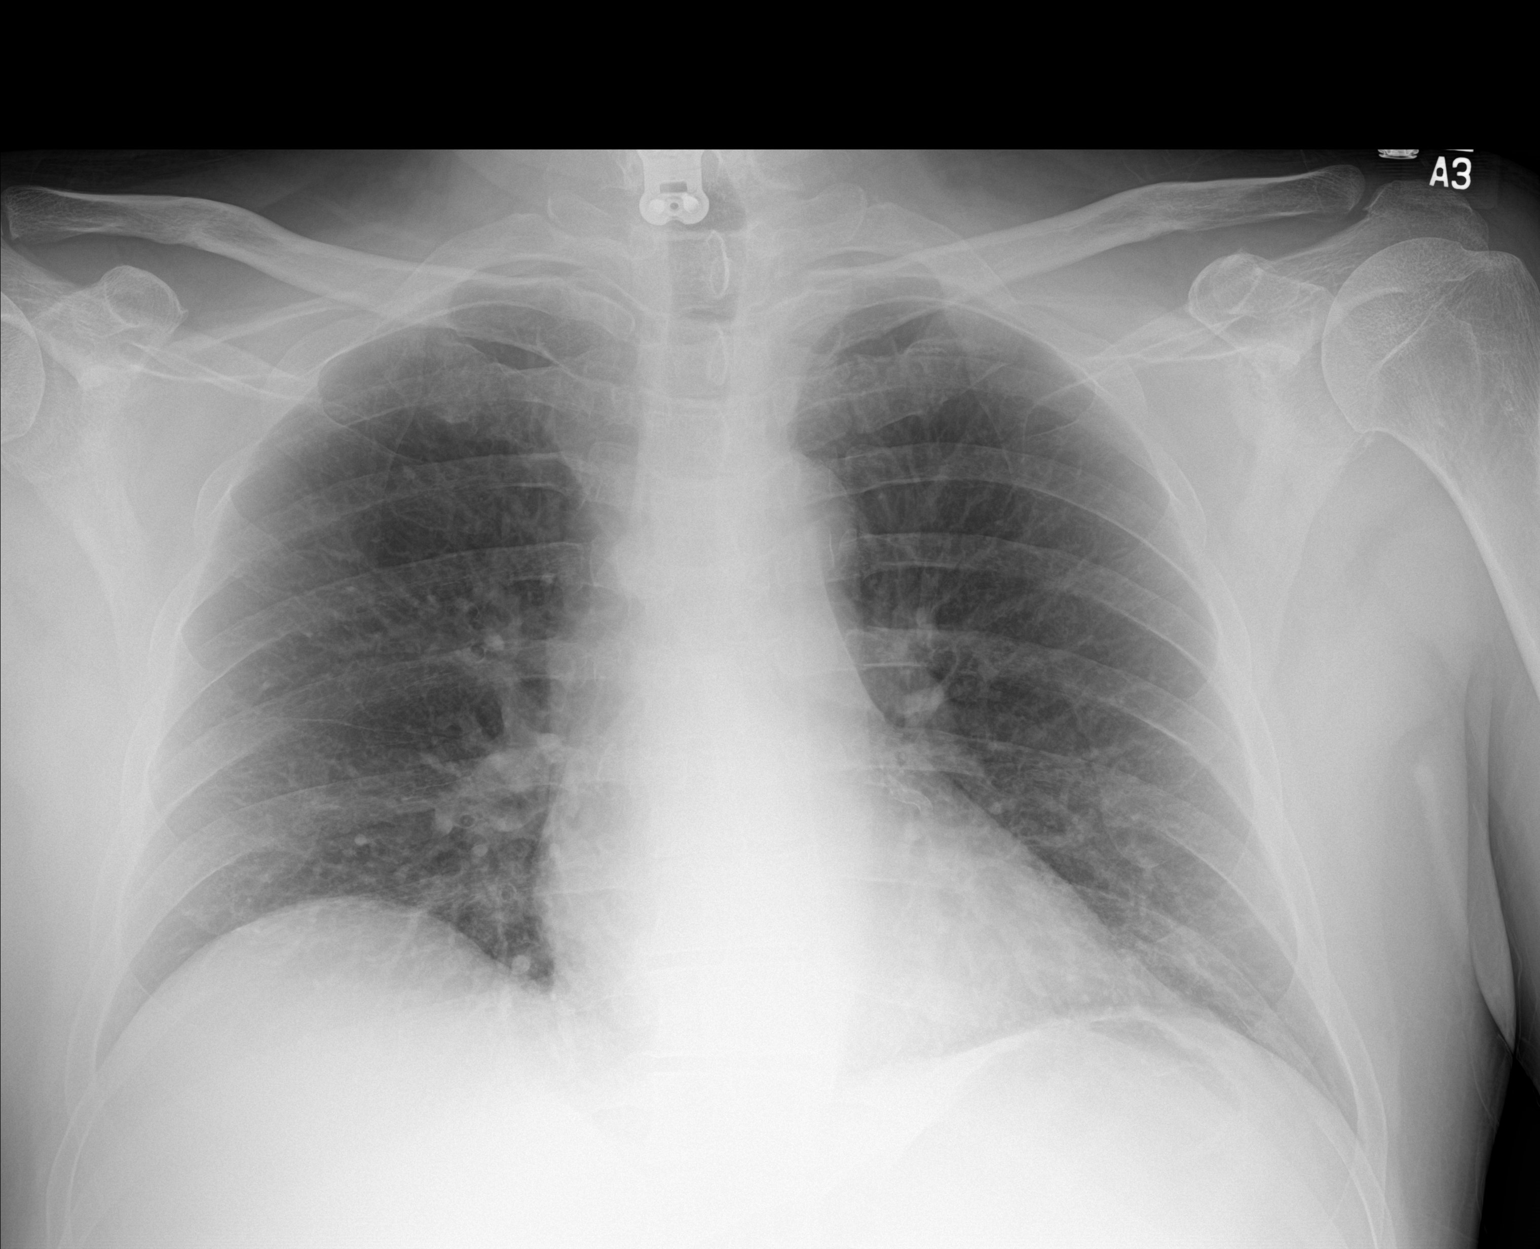

[1 of 1 positions shown; findings below may reference images not displayed]

FINDINGS: Minimal bibasilar atelectasis. Heart is normal size. No effusions.
No acute bony abnormality.
IMPRESSION: Bibasilar atelectasis.

## 2018-12-19 DIAGNOSIS — Z794 Long term (current) use of insulin: Secondary | ICD-10-CM | POA: Diagnosis not present

## 2018-12-19 DIAGNOSIS — E1065 Type 1 diabetes mellitus with hyperglycemia: Secondary | ICD-10-CM | POA: Diagnosis not present

## 2018-12-19 DIAGNOSIS — E109 Type 1 diabetes mellitus without complications: Secondary | ICD-10-CM | POA: Diagnosis not present

## 2019-01-03 ENCOUNTER — Encounter: Payer: Self-pay | Admitting: Cardiology

## 2019-01-03 ENCOUNTER — Ambulatory Visit: Payer: 59 | Admitting: Cardiology

## 2019-01-03 VITALS — BP 144/86 | HR 65 | Ht 72.0 in | Wt 214.1 lb

## 2019-01-03 DIAGNOSIS — E7800 Pure hypercholesterolemia, unspecified: Secondary | ICD-10-CM | POA: Diagnosis not present

## 2019-01-03 DIAGNOSIS — I10 Essential (primary) hypertension: Secondary | ICD-10-CM | POA: Diagnosis not present

## 2019-01-03 DIAGNOSIS — I2510 Atherosclerotic heart disease of native coronary artery without angina pectoris: Secondary | ICD-10-CM | POA: Diagnosis not present

## 2019-01-03 MED ORDER — ASPIRIN EC 81 MG PO TBEC: 81.0000 mg | DELAYED_RELEASE_TABLET | Freq: Every day | ORAL | 3 refills | Status: AC

## 2019-01-03 NOTE — Patient Instructions (Signed)
Medication Instructions:  Please take ASA 81 mg daily. Continue all other medications as listed.  If you need a refill on your cardiac medications before your next appointment, please call your pharmacy.   Follow-Up: At [facility], you and your health needs are our priority.  As part of our continuing mission to provide you with exceptional heart care, we have created designated Provider Care Teams.  These Care Teams include your primary Cardiologist (physician) and Advanced Practice Providers (APPs -  Physician Assistants and Nurse Practitioners) who all work together to provide you with the care you need, when you need it. You will need a follow up appointment in 12 months.  Please call our office 2 months in advance to schedule this appointment.  You may see Donato Schultz, MD or one of the following Advanced Practice Providers on your designated Care Team:   Norma Fredrickson, NP Nada Boozer, NP . Georgie Chard, NP  Thank you for choosing [facility]!!

## 2019-01-03 NOTE — Progress Notes (Signed)
Cardiology Office Note   Date:  01/03/2019   ID:  Ian Houston Ian Houston, DOB 09-07-1961, MRN [id number]  PCP:  Ian Houston, John, MD  Cardiologist:  Dr. Donato SchultzMark Remberto Houston    History of Present Illness: Ian Houston is a 58 y.o. male with a hx of T1DM and former smoker, no longer dipping.  He was admitted 10/22-10/27/15 with DKA and hyperkalemia in the setting of insulin pump failure.  Initial EKG demonstrated lateral ST changes initially thought to be related to metabolic derangement.  However, he subsequently ruled in for NSTEMI (peak Tn 7.5).  He was taken for cardiac catheterization. This demonstrated severe stenosis in the mid LAD as well as high-grade stenosis in a small D1. LAD was treated with a DES. Ejection fraction remained preserved.   He was on DAPT x 1 year (stopped in Oct 2016) and remains on ASA daily.  He returns for follow-up.  He is doing well. He denies chest discomfort, shortness of breath, syncope, orthopnea, PND or edema.  He reports snoring and has known diagnosis of OSA for which he uses an oral appliance.  Had neck surgery in 2011 and 2017. 7 weeks of neck immobilization. Occasionally he will have sharp fleeting pinpoint chest discomfort. Atypical. Noncardiac. 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  01/03/2019- here today, unfortunately his father died little over a week ago.  At age 58.  He also had a friend who is a another Emergency planning/management officerpolice officer that died over the holidays.  Been a rough start to the year he states.  Overall he does report medication compliance.  Denies any fevers chills nausea vomiting syncope bleeding.  Studies:  - LHC (09/29/14):  Mid LAD 90%, proximal D1 90% (small), proximal RCA 20-30%, EF 60% >> PCI:  24 mm x 3.0 Promus Premier DES to LAD.  - Echo (10/15):  Vigorous LVF, EF 65-70%, normal wall motion, mild MR   Recent Labs/Images:  Wt Readings from Last 3 Encounters:  01/03/19 214 lb 1.9 oz (97.1 kg)  12/29/17 226  lb (102.5 kg)  09/29/17 211 lb 3.2 oz (95.8 kg)     Past Medical History:  Diagnosis Date  . CAD (coronary artery disease)    a. s/p NSTEMI in the setting of DKA 10/15 >>> LHC (09/29/14):  Mid LAD 90%, proximal D1 90% (small), proximal RCA 20-30%, EF 60% >> PCI:  24 mm x 3.0 Promus Premier DES to LAD.  Marland Kitchen. Complication of anesthesia    states difficult to wake up  . Diabetes mellitus without complication (HCC)   . GERD (gastroesophageal reflux disease)   . History of echocardiogram    a.  Echo (10/15):  Vigorous LVF, EF 65-70%, normal wall motion, mild MR  . Hyperlipidemia   . Hypertension   . Tobacco abuse    a. chewing tobacco    Current Outpatient Medications  Medication Sig Dispense Refill  . acamprosate (CAMPRAL) 333 MG tablet Take 3 tablets by mouth 2 (two) times daily.    . Insulin Human (INSULIN PUMP) SOLN bolus as directed 4-6 times per day (Patient taking differently: bolus as directed 4-6 times per day *Humalog*)    . lisinopril (PRINIVIL,ZESTRIL) 5 MG tablet Take 5 mg by mouth daily.    . naltrexone (DEPADE) 50 MG tablet Take 50 mg by mouth daily.    . nitroGLYCERIN (NITROSTAT) 0.4 MG SL tablet Place 1 tablet (0.4 mg total) under the tongue every 5 (five) minutes as needed  for chest pain. 25 tablet 12  . ONE TOUCH ULTRA TEST test strip CHECK BLOOD SUGAR 3X A DAY. DX CODE-E10.65  5  . rosuvastatin (CRESTOR) 40 MG tablet Take 40 mg by mouth daily.    Marland Kitchen. aspirin EC 81 MG tablet Take 1 tablet (81 mg total) by mouth daily. 90 tablet 3   No current facility-administered medications for this visit.      Allergies:   Sulfa antibiotics   Social History:  The patient  reports that he has been smoking cigars. He has smoked for the past 20.00 years. He has quit using smokeless tobacco.  His smokeless tobacco use included chew. He reports current alcohol use of about 14.0 standard drinks of alcohol per week. He reports that he does not use drugs.   Family History:  The patient's  family history includes Emphysema in his mother; Leukemia in his paternal grandmother.   ROS:  Please see the history of present illness. + snoring.  All other systems reviewed and negative.   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  12/29/17 - NSR no other changes. Personally viewed. 11/05/15 - NSR, HR 67, normal axis, no ST changes     Echocardiogram: 09/26/14 - EF 65-70%  ASSESSMENT AND PLAN:  1.  Coronary artery disease:  Doing well after non-STEMI in the setting of DKA treated with a DES to the LAD 09/29/14.  Was on DAPT x 1 year (stopped in Oct 2016).     -  Continue with ASA 81, high dose statin.  I stop metoprolol at a prior office visit.  Seems you doing quite well    2.  Hyperlipidemia:  Managed by Dr. Timothy Houston. Goal LDL less than 70. Doing well, no adverse drug reaction.  On high-dose statin.  Crestor 40 mg.  States that he was taking crestor during last check in 12/28/2017-LDL 147.  Consider PCSK9  3.  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  Recent A1c 7.  Aim for good control.  Follow-up by Dr. Timothy Houston.  Last hemoglobin A1c 7.6.  4. Cervical spine disc injury- followed by Dr. Jeral Houston. Successful second surgery in 2017. No change.  Overall doing well.   5. Former tobacco user-smoked several years ago.  Reported occasional cigar use previously.  Counseled  to decrease cigar use previously. No change.  Disposition:   FU in 1 year.   Signed Ian SchultzMark Heston Widener, MD 01/03/2019 4:30 PM    [facility] [address], [place], [place]  [address] Phone: [phone]; Fax: [phone]

## 2019-01-04 DIAGNOSIS — E1065 Type 1 diabetes mellitus with hyperglycemia: Secondary | ICD-10-CM | POA: Diagnosis not present

## 2019-01-04 DIAGNOSIS — R82998 Other abnormal findings in urine: Secondary | ICD-10-CM | POA: Diagnosis not present

## 2019-01-04 DIAGNOSIS — Z0000 Encounter for general adult medical examination without abnormal findings: Secondary | ICD-10-CM | POA: Diagnosis not present

## 2019-01-04 DIAGNOSIS — Z125 Encounter for screening for malignant neoplasm of prostate: Secondary | ICD-10-CM | POA: Diagnosis not present

## 2019-01-11 DIAGNOSIS — E7849 Other hyperlipidemia: Secondary | ICD-10-CM | POA: Diagnosis not present

## 2019-01-11 DIAGNOSIS — I2510 Atherosclerotic heart disease of native coronary artery without angina pectoris: Secondary | ICD-10-CM | POA: Diagnosis not present

## 2019-01-11 DIAGNOSIS — Z0000 Encounter for general adult medical examination without abnormal findings: Secondary | ICD-10-CM | POA: Diagnosis not present

## 2019-01-11 DIAGNOSIS — I119 Hypertensive heart disease without heart failure: Secondary | ICD-10-CM | POA: Diagnosis not present

## 2019-01-15 DIAGNOSIS — Z79899 Other long term (current) drug therapy: Secondary | ICD-10-CM | POA: Diagnosis not present

## 2019-02-15 DIAGNOSIS — E1065 Type 1 diabetes mellitus with hyperglycemia: Secondary | ICD-10-CM | POA: Diagnosis not present

## 2019-02-15 DIAGNOSIS — Z794 Long term (current) use of insulin: Secondary | ICD-10-CM | POA: Diagnosis not present

## 2019-02-15 DIAGNOSIS — E109 Type 1 diabetes mellitus without complications: Secondary | ICD-10-CM | POA: Diagnosis not present

## 2019-03-11 DIAGNOSIS — E109 Type 1 diabetes mellitus without complications: Secondary | ICD-10-CM | POA: Diagnosis not present

## 2019-03-11 DIAGNOSIS — E108 Type 1 diabetes mellitus with unspecified complications: Secondary | ICD-10-CM | POA: Diagnosis not present

## 2019-03-19 DIAGNOSIS — Z794 Long term (current) use of insulin: Secondary | ICD-10-CM | POA: Diagnosis not present

## 2019-03-19 DIAGNOSIS — E1065 Type 1 diabetes mellitus with hyperglycemia: Secondary | ICD-10-CM | POA: Diagnosis not present

## 2019-03-19 DIAGNOSIS — E109 Type 1 diabetes mellitus without complications: Secondary | ICD-10-CM | POA: Diagnosis not present

## 2020-01-15 ENCOUNTER — Other Ambulatory Visit: Payer: Self-pay

## 2020-01-15 ENCOUNTER — Encounter: Payer: Self-pay | Admitting: Cardiology

## 2020-01-15 ENCOUNTER — Ambulatory Visit: Payer: 59 | Admitting: Cardiology

## 2020-01-15 VITALS — BP 114/80 | HR 64 | Ht 72.0 in | Wt 217.2 lb

## 2020-01-15 DIAGNOSIS — E1169 Type 2 diabetes mellitus with other specified complication: Secondary | ICD-10-CM | POA: Diagnosis not present

## 2020-01-15 DIAGNOSIS — E785 Hyperlipidemia, unspecified: Secondary | ICD-10-CM

## 2020-01-15 DIAGNOSIS — I2510 Atherosclerotic heart disease of native coronary artery without angina pectoris: Secondary | ICD-10-CM | POA: Diagnosis not present

## 2020-01-15 DIAGNOSIS — E7800 Pure hypercholesterolemia, unspecified: Secondary | ICD-10-CM | POA: Diagnosis not present

## 2020-01-15 NOTE — Progress Notes (Signed)
Cardiology Office Note:    Date:  01/15/2020   ID:  Ian Houston, DOB 1961/05/03, MRN [id number]  PCP:  Creola Corn, MD  Cardiologist:  Donato Schultz, MD  Electrophysiologist:  None   Referring MD: Creola Corn, MD     History of Present Illness:    Ian Houston is a 59 y.o. male with diabetes former smoker prior DKA with coronary artery disease prior non-STEMI lateral ST changes with elevated troponin.  Cardiac catheterization demonstrated LAD disease, treated with DES in 2015.  Very rare shortness of breath, he states it may be last 1 second.  Sometimes he is still feeling fatigued.  Switched from lisinopril to losartan.  Compliant with his medications.  Snoring, OSA, oral appliance  Neck surgery 2011 2017.  Enjoys job with Hydrologist   Past Medical History:  Diagnosis Date  . CAD (coronary artery disease)    a. s/p NSTEMI in the setting of DKA 10/15 >>> LHC (09/29/14):  Mid LAD 90%, proximal D1 90% (small), proximal RCA 20-30%, EF 60% >> PCI:  24 mm x 3.0 Promus Premier DES to LAD.  Marland Kitchen Complication of anesthesia    states difficult to wake up  . Diabetes mellitus without complication (HCC)   . GERD (gastroesophageal reflux disease)   . History of echocardiogram    a.  Echo (10/15):  Vigorous LVF, EF 65-70%, normal wall motion, mild MR  . Hyperlipidemia   . Hypertension   . Tobacco abuse    a. chewing tobacco    Past Surgical History:  Procedure Laterality Date  . ANTERIOR CERVICAL DECOMP/DISCECTOMY FUSION N/A 04/29/2016   Procedure: CERVICAL FOUR-FIVE ANTERIOR CERVICAL DECOMPRESSION/DISKECTOMY/FUSION;  Surgeon: Hilda Lias, MD;  Location: [facility];  Service: Neurosurgery;  Laterality: N/A;  . BACK SURGERY  2011  . LEFT HEART CATHETERIZATION WITH CORONARY ANGIOGRAM N/A 09/29/2014   Procedure: LEFT HEART CATHETERIZATION WITH CORONARY ANGIOGRAM;  Surgeon: Lesleigh Noe, MD;  Location: [facility] CATH LAB;  Service:  Cardiovascular;  Laterality: N/A;  . NASAL SEPTUM SURGERY  1998?    Current Medications: Current Meds  Medication Sig  . aspirin EC 81 MG tablet Take 1 tablet (81 mg total) by mouth daily.  Marland Kitchen HUMALOG 100 UNIT/ML injection SMARTSIG:0-100 Unit(s) SUB-Q Daily  . Insulin Human (INSULIN PUMP) SOLN bolus as directed 4-6 times per day  . losartan (COZAAR) 50 MG tablet Take 50 mg by mouth daily.  . nitroGLYCERIN (NITROSTAT) 0.4 MG SL tablet Place 1 tablet (0.4 mg total) under the tongue every 5 (five) minutes as needed for chest pain.  . ONE TOUCH ULTRA TEST test strip CHECK BLOOD SUGAR 3X A DAY. DX CODE-E10.65  . rosuvastatin (CRESTOR) 40 MG tablet Take 40 mg by mouth daily.     Allergies:   Sulfa antibiotics   Social History   Socioeconomic History  . Marital status: Married    Spouse name: Not on file  . Number of children: Not on file  . Years of education: Not on file  . Highest education level: Not on file  Occupational History  . Not on file  Tobacco Use  . Smoking status: Current Every Day Smoker    Years: 20.00    Types: Cigars  . Smokeless tobacco: Former Neurosurgeon    Types: Chew  Substance and Sexual Activity  . Alcohol use: Yes    Alcohol/week: 14.0 standard drinks    Types: 14 Shots of liquor per week  Comment: daily  . Drug use: No  . Sexual activity: Not on file  Other Topics Concern  . Not on file  Social History Narrative  . Not on file   Social Determinants of Health   Financial Resource Strain:   . Difficulty of Paying Living Expenses: Not on file  Food Insecurity:   . Worried About Charity fundraiser in the Last Year: Not on file  . Ran Out of Food in the Last Year: Not on file  Transportation Needs:   . Lack of Transportation (Medical): Not on file  . Lack of Transportation (Non-Medical): Not on file  Physical Activity:   . Days of Exercise per Week: Not on file  . Minutes of Exercise per Session: Not on file  Stress:   . Feeling of Stress : Not  on file  Social Connections:   . Frequency of Communication with Friends and Family: Not on file  . Frequency of Social Gatherings with Friends and Family: Not on file  . Attends Religious Services: Not on file  . Active Member of Clubs or Organizations: Not on file  . Attends Archivist Meetings: Not on file  . Marital Status: Not on file     Family History: The patient's family history includes Emphysema in his mother; Leukemia in his paternal grandmother.  ROS:   Please see the history of present illness.    No fevers chills nausea vomiting syncope bleeding all other systems reviewed and are negative.  EKGs/Labs/Other Studies Reviewed:    The following studies were reviewed today:   - LHC (09/29/14):  Mid LAD 90%, proximal D1 90% (small), proximal RCA 20-30%, EF 60% >> PCI:  24 mm x 3.0 Promus Premier DES to LAD.  2015 EF 65 to 70%  EKG:  EKG is  ordered today.  The ekg ordered today demonstrates sinus rhythm 64 with no other abnormalities prior showed normal sinus rhythm without any changes.  Recent Labs: No results found for requested labs within last 8760 hours.  Recent Lipid Panel    Component Value Date/Time   CHOL 171 09/28/2014 0510   TRIG 112 09/28/2014 0510   HDL 64 09/28/2014 0510   CHOLHDL 2.7 09/28/2014 0510   VLDL 22 09/28/2014 0510   LDLCALC 85 09/28/2014 0510    Physical Exam:    VS:  BP 114/80   Pulse 64   Ht 6' (1.829 m)   Wt 217 lb 3.2 oz (98.5 kg)   SpO2 95%   BMI 29.46 kg/m     Wt Readings from Last 3 Encounters:  01/15/20 217 lb 3.2 oz (98.5 kg)  01/03/19 214 lb 1.9 oz (97.1 kg)  12/29/17 226 lb (102.5 kg)     GEN:  Well nourished, well developed in no acute distress HEENT: Normal NECK: No JVD; No carotid bruits LYMPHATICS: No lymphadenopathy CARDIAC: RRR, no murmurs, rubs, gallops RESPIRATORY:  Clear to auscultation without rales, wheezing or rhonchi  ABDOMEN: Soft, non-tender, non-distended MUSCULOSKELETAL:  No edema;  No deformity  SKIN: Warm and dry NEUROLOGIC:  Alert and oriented x 3 PSYCHIATRIC:  Normal affect   ASSESSMENT:    1. Coronary artery disease involving native coronary artery of native heart without angina pectoris   2. Type 2 diabetes mellitus with hyperlipidemia (HCC)   3. Pure hypercholesterolemia   4. Elevated LDL cholesterol level    PLAN:    In order of problems listed above:  Coronary artery disease status post DES to LAD  in 2015 in the setting of non-STEMI -Currently on monotherapy antiplatelet.  Occasional lower abdominal bloating noncardiac discomfort.  Previously was having fatigue legs felt weak, I stopped metoprolol low-dose to see if this would help.  Seems to be doing quite well with this change however he still has some mild fatigue.  Encouraged exercise.  Diabetes with hyperlipidemia -Dr. Timothy Lasso has been managing closely.  High intensity statin.  LDL goal less than 70.  Even though he was on Crestor 40 mg last LDL was 101.  I will refer him to lipid clinic for consideration of PCSK9 agent.  I do not think that addition of Zetia will provide goal, however this may be a reasonable first option.  His ALT was 83 on 01/10/2020.  This is not 3 times the upper limit of normal.  I do not think we need to adjust his Crestor because of this.  Hemoglobin A1c 7.8.  Dr. Timothy Lasso will be discussing at his yearly physical.  Former tobacco user -Rare cigar.   Medication Adjustments/Labs and Tests Ordered: Current medicines are reviewed at length with the patient today.  Concerns regarding medicines are outlined above.  Orders Placed This Encounter  Procedures  . AMB Referral to Advanced Lipid Disorders Clinic  . EKG 12-Lead   No orders of the defined types were placed in this encounter.   Patient Instructions  Medication Instructions:  The current medical regimen is effective;  continue present plan and medications.  *If you need a refill on your cardiac medications before your  next appointment, please call your pharmacy*  You have been referred to the Lipid Clinic.  Follow-Up: At [facility], you and your health needs are our priority.  As part of our continuing mission to provide you with exceptional heart care, we have created designated Provider Care Teams.  These Care Teams include your primary Cardiologist (physician) and Advanced Practice Providers (APPs -  Physician Assistants and Nurse Practitioners) who all work together to provide you with the care you need, when you need it.  Your next appointment:   12 month(s)  The format for your next appointment:   In Person  Provider:   Donato Schultz, MD  Thank you for choosing [facility]!!        Signed, Donato Schultz, MD  01/15/2020 9:08 AM    [facility]

## 2020-01-15 NOTE — Patient Instructions (Signed)
Medication Instructions:  The current medical regimen is effective;  continue present plan and medications.  *If you need a refill on your cardiac medications before your next appointment, please call your pharmacy*  You have been referred to the Lipid Clinic.  Follow-Up: At [facility], you and your health needs are our priority.  As part of our continuing mission to provide you with exceptional heart care, we have created designated Provider Care Teams.  These Care Teams include your primary Cardiologist (physician) and Advanced Practice Providers (APPs -  Physician Assistants and Nurse Practitioners) who all work together to provide you with the care you need, when you need it.  Your next appointment:   12 month(s)  The format for your next appointment:   In Person  Provider:   Donato Schultz, MD  Thank you for choosing [facility]!!

## 2020-01-22 ENCOUNTER — Telehealth: Payer: Self-pay | Admitting: Pharmacist

## 2020-01-22 NOTE — Telephone Encounter (Signed)
Left voicemail for patient that office would be closed tomorrow due to bad weather. Gave patient the option to do telephone call tomorrow or to reschedule for in person another day. Asked patient to return call.

## 2020-01-23 ENCOUNTER — Ambulatory Visit: Payer: 59

## 2020-01-27 ENCOUNTER — Ambulatory Visit (INDEPENDENT_AMBULATORY_CARE_PROVIDER_SITE_OTHER): Payer: 59 | Admitting: Pharmacist

## 2020-01-27 ENCOUNTER — Encounter: Payer: Self-pay | Admitting: Pharmacist

## 2020-01-27 ENCOUNTER — Other Ambulatory Visit: Payer: Self-pay

## 2020-01-27 DIAGNOSIS — E7800 Pure hypercholesterolemia, unspecified: Secondary | ICD-10-CM | POA: Diagnosis not present

## 2020-01-27 NOTE — Progress Notes (Signed)
Patient ID: KENDYN ZAMAN                 DOB: 1961-01-30                    MRN: [id number]     HPI: Ian Houston is a 59 y.o. male patient referred to lipid clinic by Dr. Marlou Porch. PMH is significant for diabetes former smoker prior DKA with coronary artery disease prior non-STEMI lateral ST changes with elevated troponin.  Cardiac catheterization demonstrated LAD disease, treated with DES in 2015.  Patient cholesterol previously being managed by his PCP Dr. Virgina Jock. LDL still 101 on max dose statin. Patient presents today to discuss options for lipid lowering therapy.   Current Medications: rosuvastatin 40mg  daily Intolerances: atorvastatin 40mg , 80mg  Risk Factors: DM, NSTEMI, HTN, premature CAD LDL goal: <55  Diet: sausage bisquet 2-3 times/week, deli meat sandwich, can of chili, corn beef, tacos, chuck roast, occasional chicken, burbon  Exercise: patient walks a lot at work but admits he could do more  Family History: The patient's family history includes Emphysema in his mother; Leukemia in his paternal grandmother.  Social History: cigar smoker, + ETOH (2 shots of burbon)  Labs:01/10/20 TC 203 HDL 77 LDL 101 TG 127 ALT 83 (1.5 x ULN - monitor)  Past Medical History:  Diagnosis Date  . CAD (coronary artery disease)    a. s/p NSTEMI in the setting of DKA 10/15 >>> LHC (09/29/14):  Mid LAD 90%, proximal D1 90% (small), proximal RCA 20-30%, EF 60% >> PCI:  24 mm x 3.0 Promus Premier DES to LAD.  Marland Kitchen Complication of anesthesia    states difficult to wake up  . Diabetes mellitus without complication ([place])   . GERD (gastroesophageal reflux disease)   . History of echocardiogram    a.  Echo (10/15):  Vigorous LVF, EF 65-70%, normal wall motion, mild MR  . Hyperlipidemia   . Hypertension   . Tobacco abuse    a. chewing tobacco    Current Outpatient Medications on File Prior to Visit  Medication Sig Dispense Refill  . aspirin EC 81 MG tablet Take 1 tablet (81 mg total) by mouth daily.  90 tablet 3  . HUMALOG 100 UNIT/ML injection SMARTSIG:0-100 Unit(s) SUB-Q Daily    . Insulin Human (INSULIN PUMP) SOLN bolus as directed 4-6 times per day    . losartan (COZAAR) 50 MG tablet Take 50 mg by mouth daily.    . nitroGLYCERIN (NITROSTAT) 0.4 MG SL tablet Place 1 tablet (0.4 mg total) under the tongue every 5 (five) minutes as needed for chest pain. 25 tablet 12  . ONE TOUCH ULTRA TEST test strip CHECK BLOOD SUGAR 3X A DAY. DX CODE-E10.65  5  . rosuvastatin (CRESTOR) 40 MG tablet Take 40 mg by mouth daily.     No current facility-administered medications on file prior to visit.    Allergies  Allergen Reactions  . Sulfa Antibiotics Other (See Comments)    Flu like symptoms    Assessment/Plan:  1. Hyperlipidemia - LDL is above goal of <55. Discussed options of PCSK9i vs ezetimibe. Discussed side effects and cardiovascular benefits. Discussed that zetia will not get his LDL to goal, nor will it provide cardiovascular protection in a timely manner (no benefit for 4-5 years vs 6 months for [facility]) Patient agreeable to try PCSK9i. He is familiar with injections and comfortable with them. Injection technique reviewed. I will submit prior auth for Repatha and will  get patient copay card as he has Nurse, learning disability. Educated patient on lower fat diet and limiting alcohol. Patient admits he drinks 2 shots of burbon every day, but is trying to quite. His PCP is helping him with resources.   Thank you,  Olene Floss, Pharm.D, BCPS, CPP [facility]  [address], [place], [place] [address]  Phone: [phone]; Fax: [phone]

## 2020-01-27 NOTE — Patient Instructions (Signed)
It was a pleasure to meet you today!  We will submit a prior authorization for Repatha. I will call you once its approved. We will also get you a copay card.  Please call us at [phone] with any questions or concers.

## 2020-01-28 ENCOUNTER — Telehealth: Payer: Self-pay | Admitting: Pharmacist

## 2020-01-28 DIAGNOSIS — E7800 Pure hypercholesterolemia, unspecified: Secondary | ICD-10-CM

## 2020-01-28 MED ORDER — REPATHA SURECLICK 140 MG/ML ~~LOC~~ SOAJ: 1.0000 "pen " | SUBCUTANEOUS | 11 refills | Status: DC

## 2020-01-28 NOTE — Telephone Encounter (Signed)
Prior authorization for Repatha approved through 07/26/20. Copay card and Rx sent to [facility] Patient made aware. Labs scheduled for 10 weeks

## 2020-03-31 ENCOUNTER — Telehealth: Payer: Self-pay | Admitting: Pharmacist

## 2020-03-31 NOTE — Telephone Encounter (Signed)
Patient states that has lab work scheduled for 04/09/20 was for him to get done since he started on Repatha. He states that he started Repatha later than he thought and wants to know if he should push the lab appointment out.

## 2020-03-31 NOTE — Telephone Encounter (Signed)
Patient 1st Repatha dose was 1 week ago. Will repeat fasting blood work around the 4th dose.

## 2020-04-09 ENCOUNTER — Other Ambulatory Visit: Payer: 59

## 2020-05-06 ENCOUNTER — Other Ambulatory Visit: Payer: Self-pay

## 2020-05-06 ENCOUNTER — Other Ambulatory Visit: Payer: 59 | Admitting: *Deleted

## 2020-05-06 DIAGNOSIS — E7800 Pure hypercholesterolemia, unspecified: Secondary | ICD-10-CM

## 2020-05-06 LAB — HEPATIC FUNCTION PANEL
ALT: 15 IU/L (ref 0–44)
AST: 16 IU/L (ref 0–40)
Albumin: 4.1 g/dL (ref 3.8–4.9)
Alkaline Phosphatase: 95 IU/L (ref 48–121)
Bilirubin Total: 0.6 mg/dL (ref 0.0–1.2)
Bilirubin, Direct: 0.2 mg/dL (ref 0.00–0.40)
Total Protein: 6.3 g/dL (ref 6.0–8.5)

## 2020-05-06 LAB — LDL CHOLESTEROL, DIRECT: LDL Direct: 44 mg/dL (ref 0–99)

## 2020-05-06 LAB — LIPID PANEL
Chol/HDL Ratio: 1.9 ratio (ref 0.0–5.0)
Cholesterol, Total: 118 mg/dL (ref 100–199)
HDL: 62 mg/dL (ref >39)
LDL Chol Calc (NIH): 39 mg/dL (ref 0–99)
Triglycerides: 92 mg/dL (ref 0–149)
VLDL Cholesterol Cal: 17 mg/dL (ref 5–40)

## 2020-12-28 ENCOUNTER — Encounter (HOSPITAL_COMMUNITY): Payer: Self-pay

## 2020-12-28 ENCOUNTER — Other Ambulatory Visit: Payer: Self-pay

## 2020-12-28 DIAGNOSIS — Z79899 Other long term (current) drug therapy: Secondary | ICD-10-CM

## 2020-12-28 DIAGNOSIS — E860 Dehydration: Secondary | ICD-10-CM | POA: Diagnosis present

## 2020-12-28 DIAGNOSIS — D72828 Other elevated white blood cell count: Secondary | ICD-10-CM | POA: Diagnosis present

## 2020-12-28 DIAGNOSIS — Z794 Long term (current) use of insulin: Secondary | ICD-10-CM

## 2020-12-28 DIAGNOSIS — Z7982 Long term (current) use of aspirin: Secondary | ICD-10-CM

## 2020-12-28 DIAGNOSIS — R112 Nausea with vomiting, unspecified: Secondary | ICD-10-CM | POA: Diagnosis present

## 2020-12-28 DIAGNOSIS — E1010 Type 1 diabetes mellitus with ketoacidosis without coma: Principal | ICD-10-CM | POA: Diagnosis present

## 2020-12-28 DIAGNOSIS — Z9641 Presence of insulin pump (external) (internal): Secondary | ICD-10-CM | POA: Diagnosis present

## 2020-12-28 DIAGNOSIS — Z20822 Contact with and (suspected) exposure to covid-19: Secondary | ICD-10-CM | POA: Diagnosis present

## 2020-12-28 DIAGNOSIS — E785 Hyperlipidemia, unspecified: Secondary | ICD-10-CM | POA: Diagnosis present

## 2020-12-28 DIAGNOSIS — I2510 Atherosclerotic heart disease of native coronary artery without angina pectoris: Secondary | ICD-10-CM | POA: Diagnosis present

## 2020-12-28 DIAGNOSIS — Z955 Presence of coronary angioplasty implant and graft: Secondary | ICD-10-CM

## 2020-12-28 DIAGNOSIS — E1059 Type 1 diabetes mellitus with other circulatory complications: Secondary | ICD-10-CM | POA: Diagnosis present

## 2020-12-28 DIAGNOSIS — I10 Essential (primary) hypertension: Secondary | ICD-10-CM | POA: Diagnosis present

## 2020-12-28 DIAGNOSIS — N179 Acute kidney failure, unspecified: Secondary | ICD-10-CM | POA: Diagnosis present

## 2020-12-28 DIAGNOSIS — K219 Gastro-esophageal reflux disease without esophagitis: Secondary | ICD-10-CM | POA: Diagnosis present

## 2020-12-28 DIAGNOSIS — I252 Old myocardial infarction: Secondary | ICD-10-CM

## 2020-12-28 DIAGNOSIS — F17290 Nicotine dependence, other tobacco product, uncomplicated: Secondary | ICD-10-CM | POA: Diagnosis present

## 2020-12-28 LAB — CBC
HCT: 52.5 % — ABNORMAL HIGH (ref 39.0–52.0)
Hemoglobin: 17 g/dL (ref 13.0–17.0)
MCH: 29.6 pg (ref 26.0–34.0)
MCHC: 32.4 g/dL (ref 30.0–36.0)
MCV: 91.5 fL (ref 80.0–100.0)
Platelets: 375 10*3/uL (ref 150–400)
RBC: 5.74 MIL/uL (ref 4.22–5.81)
RDW: 13.2 % (ref 11.5–15.5)
WBC: 17.7 10*3/uL — ABNORMAL HIGH (ref 4.0–10.5)
nRBC: 0 % (ref 0.0–0.2)

## 2020-12-28 LAB — BASIC METABOLIC PANEL
Anion gap: 22 — ABNORMAL HIGH (ref 5–15)
BUN: 25 mg/dL — ABNORMAL HIGH (ref 6–20)
CO2: 13 mmol/L — ABNORMAL LOW (ref 22–32)
Calcium: 9.5 mg/dL (ref 8.9–10.3)
Chloride: 100 mmol/L (ref 98–111)
Creatinine, Ser: 1.31 mg/dL — ABNORMAL HIGH (ref 0.61–1.24)
GFR, Estimated: >60 mL/min (ref >60)
Glucose, Bld: 251 mg/dL — ABNORMAL HIGH (ref 70–99)
Potassium: 4.8 mmol/L (ref 3.5–5.1)
Sodium: 135 mmol/L (ref 135–145)

## 2020-12-28 LAB — CBG MONITORING, ED: Glucose-Capillary: 256 mg/dL — ABNORMAL HIGH (ref 70–99)

## 2020-12-28 NOTE — ED Triage Notes (Signed)
Patient arrived stating he had DKA in 2015 and today he started having NV. Reports having an insulin pump but has been unable to get his blood sugar down, concerned he is in DKA again.

## 2020-12-29 ENCOUNTER — Encounter (HOSPITAL_COMMUNITY): Payer: Self-pay | Admitting: Internal Medicine

## 2020-12-29 ENCOUNTER — Inpatient Hospital Stay (HOSPITAL_COMMUNITY)
Admission: EM | Admit: 2020-12-29 | Discharge: 2020-12-31 | DRG: 638 | Disposition: A | Discharge status: Home / Self Care | Payer: 59 | Attending: Family Medicine | Admitting: Family Medicine

## 2020-12-29 DIAGNOSIS — E785 Hyperlipidemia, unspecified: Secondary | ICD-10-CM | POA: Diagnosis present

## 2020-12-29 DIAGNOSIS — K219 Gastro-esophageal reflux disease without esophagitis: Secondary | ICD-10-CM | POA: Diagnosis present

## 2020-12-29 DIAGNOSIS — Z7982 Long term (current) use of aspirin: Secondary | ICD-10-CM | POA: Diagnosis not present

## 2020-12-29 DIAGNOSIS — E1059 Type 1 diabetes mellitus with other circulatory complications: Secondary | ICD-10-CM | POA: Diagnosis present

## 2020-12-29 DIAGNOSIS — I10 Essential (primary) hypertension: Secondary | ICD-10-CM | POA: Diagnosis present

## 2020-12-29 DIAGNOSIS — E0810 Diabetes mellitus due to underlying condition with ketoacidosis without coma: Secondary | ICD-10-CM | POA: Diagnosis not present

## 2020-12-29 DIAGNOSIS — E1110 Type 2 diabetes mellitus with ketoacidosis without coma: Secondary | ICD-10-CM | POA: Diagnosis present

## 2020-12-29 DIAGNOSIS — Z794 Long term (current) use of insulin: Secondary | ICD-10-CM | POA: Diagnosis not present

## 2020-12-29 DIAGNOSIS — Z9641 Presence of insulin pump (external) (internal): Secondary | ICD-10-CM | POA: Diagnosis present

## 2020-12-29 DIAGNOSIS — Z955 Presence of coronary angioplasty implant and graft: Secondary | ICD-10-CM | POA: Diagnosis not present

## 2020-12-29 DIAGNOSIS — E860 Dehydration: Secondary | ICD-10-CM | POA: Diagnosis present

## 2020-12-29 DIAGNOSIS — I2510 Atherosclerotic heart disease of native coronary artery without angina pectoris: Secondary | ICD-10-CM | POA: Diagnosis present

## 2020-12-29 DIAGNOSIS — F17290 Nicotine dependence, other tobacco product, uncomplicated: Secondary | ICD-10-CM | POA: Diagnosis present

## 2020-12-29 DIAGNOSIS — E109 Type 1 diabetes mellitus without complications: Secondary | ICD-10-CM | POA: Diagnosis not present

## 2020-12-29 DIAGNOSIS — Z79899 Other long term (current) drug therapy: Secondary | ICD-10-CM | POA: Diagnosis not present

## 2020-12-29 DIAGNOSIS — N179 Acute kidney failure, unspecified: Secondary | ICD-10-CM | POA: Diagnosis present

## 2020-12-29 DIAGNOSIS — E1010 Type 1 diabetes mellitus with ketoacidosis without coma: Secondary | ICD-10-CM

## 2020-12-29 DIAGNOSIS — R112 Nausea with vomiting, unspecified: Secondary | ICD-10-CM | POA: Diagnosis present

## 2020-12-29 DIAGNOSIS — Z20822 Contact with and (suspected) exposure to covid-19: Secondary | ICD-10-CM | POA: Diagnosis present

## 2020-12-29 DIAGNOSIS — I252 Old myocardial infarction: Secondary | ICD-10-CM | POA: Diagnosis not present

## 2020-12-29 DIAGNOSIS — D72828 Other elevated white blood cell count: Secondary | ICD-10-CM | POA: Diagnosis present

## 2020-12-29 LAB — BASIC METABOLIC PANEL
Anion gap: 18 — ABNORMAL HIGH (ref 5–15)
Anion gap: 13 (ref 5–15)
Anion gap: 11 (ref 5–15)
Anion gap: 14 (ref 5–15)
BUN: 29 mg/dL — ABNORMAL HIGH (ref 6–20)
BUN: 23 mg/dL — ABNORMAL HIGH (ref 6–20)
BUN: 18 mg/dL (ref 6–20)
BUN: 20 mg/dL (ref 6–20)
CO2: 14 mmol/L — ABNORMAL LOW (ref 22–32)
CO2: 16 mmol/L — ABNORMAL LOW (ref 22–32)
CO2: 17 mmol/L — ABNORMAL LOW (ref 22–32)
CO2: 15 mmol/L — ABNORMAL LOW (ref 22–32)
Calcium: 9.2 mg/dL (ref 8.9–10.3)
Calcium: 8.4 mg/dL — ABNORMAL LOW (ref 8.9–10.3)
Calcium: 8.4 mg/dL — ABNORMAL LOW (ref 8.9–10.3)
Calcium: 8.3 mg/dL — ABNORMAL LOW (ref 8.9–10.3)
Chloride: 99 mmol/L (ref 98–111)
Chloride: 105 mmol/L (ref 98–111)
Chloride: 106 mmol/L (ref 98–111)
Chloride: 103 mmol/L (ref 98–111)
Creatinine, Ser: 1.22 mg/dL (ref 0.61–1.24)
Creatinine, Ser: 0.98 mg/dL (ref 0.61–1.24)
Creatinine, Ser: 0.95 mg/dL (ref 0.61–1.24)
Creatinine, Ser: 1 mg/dL (ref 0.61–1.24)
GFR, Estimated: >60 mL/min (ref >60)
GFR, Estimated: >60 mL/min (ref >60)
GFR, Estimated: >60 mL/min (ref >60)
GFR, Estimated: >60 mL/min (ref >60)
Glucose, Bld: 279 mg/dL — ABNORMAL HIGH (ref 70–99)
Glucose, Bld: 158 mg/dL — ABNORMAL HIGH (ref 70–99)
Glucose, Bld: 223 mg/dL — ABNORMAL HIGH (ref 70–99)
Glucose, Bld: 281 mg/dL — ABNORMAL HIGH (ref 70–99)
Potassium: 4.9 mmol/L (ref 3.5–5.1)
Potassium: 4.7 mmol/L (ref 3.5–5.1)
Potassium: 4.7 mmol/L (ref 3.5–5.1)
Potassium: 5 mmol/L (ref 3.5–5.1)
Sodium: 131 mmol/L — ABNORMAL LOW (ref 135–145)
Sodium: 134 mmol/L — ABNORMAL LOW (ref 135–145)
Sodium: 134 mmol/L — ABNORMAL LOW (ref 135–145)
Sodium: 132 mmol/L — ABNORMAL LOW (ref 135–145)

## 2020-12-29 LAB — CBG MONITORING, ED
Glucose-Capillary: 150 mg/dL — ABNORMAL HIGH (ref 70–99)
Glucose-Capillary: 182 mg/dL — ABNORMAL HIGH (ref 70–99)
Glucose-Capillary: 182 mg/dL — ABNORMAL HIGH (ref 70–99)
Glucose-Capillary: 189 mg/dL — ABNORMAL HIGH (ref 70–99)
Glucose-Capillary: 200 mg/dL — ABNORMAL HIGH (ref 70–99)
Glucose-Capillary: 235 mg/dL — ABNORMAL HIGH (ref 70–99)
Glucose-Capillary: 244 mg/dL — ABNORMAL HIGH (ref 70–99)
Glucose-Capillary: 249 mg/dL — ABNORMAL HIGH (ref 70–99)
Glucose-Capillary: 250 mg/dL — ABNORMAL HIGH (ref 70–99)
Glucose-Capillary: 257 mg/dL — ABNORMAL HIGH (ref 70–99)
Glucose-Capillary: 285 mg/dL — ABNORMAL HIGH (ref 70–99)
Glucose-Capillary: 313 mg/dL — ABNORMAL HIGH (ref 70–99)

## 2020-12-29 LAB — CBC
HCT: 44.2 % (ref 39.0–52.0)
Hemoglobin: 15 g/dL (ref 13.0–17.0)
MCH: 30.2 pg (ref 26.0–34.0)
MCHC: 33.9 g/dL (ref 30.0–36.0)
MCV: 88.9 fL (ref 80.0–100.0)
Platelets: 231 10*3/uL (ref 150–400)
RBC: 4.97 MIL/uL (ref 4.22–5.81)
RDW: 13.3 % (ref 11.5–15.5)
WBC: 14.3 10*3/uL — ABNORMAL HIGH (ref 4.0–10.5)
nRBC: 0 % (ref 0.0–0.2)

## 2020-12-29 LAB — URINALYSIS, ROUTINE W REFLEX MICROSCOPIC
Bilirubin Urine: NEGATIVE
Glucose, UA: >=500 mg/dL — AB
Ketones, ur: 80 mg/dL — AB
Leukocytes,Ua: NEGATIVE
Nitrite: NEGATIVE
Protein, ur: 100 mg/dL — AB
Specific Gravity, Urine: 1.032 — ABNORMAL HIGH (ref 1.005–1.030)
pH: 5 (ref 5.0–8.0)

## 2020-12-29 LAB — BETA-HYDROXYBUTYRIC ACID
Beta-Hydroxybutyric Acid: 6.22 mmol/L — ABNORMAL HIGH (ref 0.05–0.27)
Beta-Hydroxybutyric Acid: 3.94 mmol/L — ABNORMAL HIGH (ref 0.05–0.27)
Beta-Hydroxybutyric Acid: 5.92 mmol/L — ABNORMAL HIGH (ref 0.05–0.27)

## 2020-12-29 LAB — HEMOGLOBIN A1C
Hgb A1c MFr Bld: 8 % — ABNORMAL HIGH (ref 4.8–5.6)
Mean Plasma Glucose: 182.9 mg/dL

## 2020-12-29 LAB — SARS CORONAVIRUS 2 (TAT 6-24 HRS): SARS Coronavirus 2: NEGATIVE

## 2020-12-29 LAB — BLOOD GAS, VENOUS
Acid-base deficit: 10.5 mmol/L — ABNORMAL HIGH (ref 0.0–2.0)
Bicarbonate: 15.3 mmol/L — ABNORMAL LOW (ref 20.0–28.0)
FIO2: 21
O2 Saturation: 58 %
Patient temperature: 98.6
pCO2, Ven: 34.6 mmHg — ABNORMAL LOW (ref 44.0–60.0)
pH, Ven: 7.268 (ref 7.250–7.430)
pO2, Ven: 31.7 mmHg — CL (ref 32.0–45.0)

## 2020-12-29 LAB — HIV ANTIBODY (ROUTINE TESTING W REFLEX): HIV Screen 4th Generation wRfx: NONREACTIVE

## 2020-12-29 MED ORDER — INSULIN REGULAR(HUMAN) IN NACL 100-0.9 UT/100ML-% IV SOLN: INTRAVENOUS | Status: DC

## 2020-12-29 MED ORDER — LACTATED RINGERS IV SOLN: INTRAVENOUS | Status: DC

## 2020-12-29 MED ORDER — PANTOPRAZOLE SODIUM 40 MG PO TBEC
40.0000 mg | DELAYED_RELEASE_TABLET | Freq: Every day | ORAL | Status: DC
  Administered 2020-12-29 – 2020-12-31 (×3): 40 mg via ORAL
  Filled 2020-12-29 (×4): qty 1

## 2020-12-29 MED ORDER — INSULIN REGULAR(HUMAN) IN NACL 100-0.9 UT/100ML-% IV SOLN
INTRAVENOUS | Status: DC
  Administered 2020-12-29: 7.5 [IU]/h via INTRAVENOUS
  Filled 2020-12-29 (×2): qty 100

## 2020-12-29 MED ORDER — HYDRALAZINE HCL 20 MG/ML IJ SOLN: 10.0000 mg | Freq: Four times a day (QID) | INTRAMUSCULAR | Status: DC | PRN

## 2020-12-29 MED ORDER — DEXTROSE 50 % IV SOLN: 0.0000 mL | INTRAVENOUS | Status: DC | PRN

## 2020-12-29 MED ORDER — SODIUM CHLORIDE 0.9 % IV BOLUS
2000.0000 mL | Freq: Once | INTRAVENOUS | Status: AC
  Administered 2020-12-29: 2000 mL via INTRAVENOUS

## 2020-12-29 MED ORDER — NITROGLYCERIN 0.4 MG SL SUBL: 0.4000 mg | SUBLINGUAL_TABLET | SUBLINGUAL | Status: DC | PRN

## 2020-12-29 MED ORDER — DEXTROSE IN LACTATED RINGERS 5 % IV SOLN: INTRAVENOUS | Status: DC

## 2020-12-29 MED ORDER — ASPIRIN EC 81 MG PO TBEC
81.0000 mg | DELAYED_RELEASE_TABLET | Freq: Every day | ORAL | Status: DC
  Administered 2020-12-29 – 2020-12-31 (×3): 81 mg via ORAL
  Filled 2020-12-29 (×3): qty 1

## 2020-12-29 MED ORDER — LACTATED RINGERS IV BOLUS: 20.0000 mL/kg | Freq: Once | INTRAVENOUS | Status: DC

## 2020-12-29 MED ORDER — ROSUVASTATIN CALCIUM 20 MG PO TABS
40.0000 mg | ORAL_TABLET | Freq: Every day | ORAL | Status: DC
  Administered 2020-12-30 – 2020-12-31 (×2): 40 mg via ORAL
  Filled 2020-12-29 (×2): qty 2

## 2020-12-29 MED ORDER — ONDANSETRON HCL 4 MG/2ML IJ SOLN
4.0000 mg | Freq: Once | INTRAMUSCULAR | Status: AC
  Administered 2020-12-29: 4 mg via INTRAVENOUS
  Filled 2020-12-29: qty 2

## 2020-12-29 MED ORDER — POTASSIUM CHLORIDE 10 MEQ/100ML IV SOLN
10.0000 meq | INTRAVENOUS | Status: AC
  Administered 2020-12-29 (×2): 10 meq via INTRAVENOUS
  Filled 2020-12-29 (×2): qty 100

## 2020-12-29 MED ORDER — POTASSIUM CHLORIDE 10 MEQ/100ML IV SOLN: 10.0000 meq | INTRAVENOUS | Status: DC

## 2020-12-29 MED ORDER — ENOXAPARIN SODIUM 40 MG/0.4ML ~~LOC~~ SOLN
40.0000 mg | SUBCUTANEOUS | Status: DC
  Administered 2020-12-29: 40 mg via SUBCUTANEOUS
  Filled 2020-12-29: qty 0.4

## 2020-12-29 NOTE — Plan of Care (Signed)
Labs were reviewed. bicarb improving, AG 11, BHB still elevated 3.94, will await next BMET and BHB. If improved, will then transition to SQ insulin.  Lantus 40units, 2 hours prior to dc insulin drip.  Novolog 5units TID AC, sensitive SSI   Maitlyn Penza M.D.  [facility] 12/29/2020, 5:45 PM

## 2020-12-29 NOTE — Progress Notes (Signed)
Inpatient Diabetes Program Recommendations  AACE/ADA: New Consensus Statement on Inpatient Glycemic Control (2015)  Target Ranges:  Prepandial:   less than 140 mg/dL      Peak postprandial:   less than 180 mg/dL (1-2 hours)      Critically ill patients:  140 - 180 mg/dL   Lab Results  Component Value Date   GLUCAP 235 (H) 12/29/2020   HGBA1C 8.3 (H) 04/26/2016   Diabetes history:  T1DM Outpatient Diabetes medications:  Medtronic insulin pump and CGM with Novolog Basal 49.625/24hr Carb ratio 1 units for 8 carbs ISF 45 Active insulin time  3hr Target  120-120 Current orders for Inpatient glycemic control:  IV Insulin  Inpatient Diabetes Program Recommendations:    When criteria is met and MD is ready to transition to sq insulin please consider, -Lantus 40 units 2 hours prior to discontinuing drip (80% of home basal) -Novolog 0-9 units TID and 0-5 units QHS -Novolog 5 units TID with meals  Note:  Spoke with patient at bedside.  He states he changed his pump site on Saturday evening.  Sunday was fine and suddenly on Monday he began to have sudden N/V with blood sugar > 300 mg/dL.  He removed site and the catheter was bend completely.  Wife administered a total of 40 units via insulin open and he was unable to get his blood sugar down and came to ED.  He is current with Dr. Virgina Jock who manages his diabetes.  He rotates his sites and wears his CGM and checks blood sugars 3-4 times per day.  Denies difficulties obtaining insulin or supplies.  Discussed hypoglycemia.  He feels low around 40's.  Explained to patient that he can call [employer] and they will replace his site at no charge.    Will continue to follow while inpatient.  Thank you, Ian Dixon, RN, BSN Diabetes Coordinator Inpatient Diabetes Program [phone] (team pager from 8a-5p)

## 2020-12-29 NOTE — ED Notes (Signed)
Admission doctor at bedside

## 2020-12-29 NOTE — ED Provider Notes (Signed)
[facility]-EMERGENCY DEPT Provider Note   CSN: [id number] Arrival date & time: 12/28/20  2119    History Chief Complaint  Patient presents with  . Hyperglycemia    Ian Houston is a 60 y.o. male with past medical history significant for type 1 diabetes, on insulin pump, CAD, hypertension, hyperlipidemia who presents for evaluation of hyperglycemia.  Around 8 AM yesterday morning he started to "feel really bad."  He went home from work.  Patient states he checked his blood sugar which was in the high 300s.  States normally he ranges 100s-120s.  Patient states he had multiple episodes of NBNB emesis, felt shaky.  Feels like his past history of DKA.  Over the course of the day gave himself at least 40 additional units of insulin however his blood sugars has been persistently in the 300s. On an insulin pump. Denies headache, lightheadedness, dizziness, chest pain, shortness of breath, abdominal pain, diarrhea, dysuria, rashes or lesions.  Denies additional aggravating or alleviating factors. Last DKA in 2015. No UR complaints, No COVID exposures.  History obtained from patient and past medical records.  No interpreter used  HPI     Past Medical History:  Diagnosis Date  . CAD (coronary artery disease)    a. s/p NSTEMI in the setting of DKA 10/15 >>> LHC (09/29/14):  Mid LAD 90%, proximal D1 90% (small), proximal RCA 20-30%, EF 60% >> PCI:  24 mm x 3.0 Promus Premier DES to LAD.  Marland Kitchen Complication of anesthesia    states difficult to wake up  . Diabetes mellitus without complication (HCC)   . GERD (gastroesophageal reflux disease)   . History of echocardiogram    a.  Echo (10/15):  Vigorous LVF, EF 65-70%, normal wall motion, mild MR  . Hyperlipidemia   . Hypertension   . Tobacco abuse    a. chewing tobacco    Patient Active Problem List   Diagnosis Date Noted  . DKA (diabetic ketoacidosis) (HCC) 09/29/2017  . HTN (hypertension) 09/29/2017  . GERD  (gastroesophageal reflux disease) 09/29/2017  . Cervical stenosis of spinal canal 04/29/2016  . 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 Coronary artery disease involving native coronary artery of native heart without angina pectoris 10/08/2014  . Hyperlipidemia 10/08/2014  . Thrush of mouth and esophagus (HCC) 09/28/2014  . Diabetic ketoacidosis associated with type 1 diabetes mellitus (HCC) 09/25/2014  . Dehydration 08/16/2014    Class: Acute    Past Surgical History:  Procedure Laterality Date  . ANTERIOR CERVICAL DECOMP/DISCECTOMY FUSION N/A 04/29/2016   Procedure: CERVICAL FOUR-FIVE ANTERIOR CERVICAL DECOMPRESSION/DISKECTOMY/FUSION;  Surgeon: Hilda Lias, MD;  Location: [facility];  Service: Neurosurgery;  Laterality: N/A;  . BACK SURGERY  2011  . LEFT HEART CATHETERIZATION WITH CORONARY ANGIOGRAM N/A 09/29/2014   Procedure: LEFT HEART CATHETERIZATION WITH CORONARY ANGIOGRAM;  Surgeon: Lesleigh Noe, MD;  Location: [facility] CATH LAB;  Service: Cardiovascular;  Laterality: N/A;  . NASAL SEPTUM SURGERY  1998?       Family History  Problem Relation Age of Onset  . Emphysema Mother   . Leukemia Paternal Grandmother     Social History   Tobacco Use  . Smoking status: Current Every Day Smoker    Years: 20.00    Types: Cigars  . Smokeless tobacco: Former Neurosurgeon    Types: Chew  Substance Use Topics  . Alcohol use: Yes    Alcohol/week: 14.0 standard drinks    Types:  14 Shots of liquor per week    Comment: daily  . Drug use: No    Home Medications Prior to Admission medications   Medication Sig Start Date End Date Taking? Authorizing Provider  aspirin EC 81 MG tablet Take 1 tablet (81 mg total) by mouth daily. 01/03/19   Jake BatheSkains, Mark C, MD  Evolocumab (REPATHA SURECLICK) 140 MG/ML SOAJ Inject 1 pen into the skin every 14 (fourteen) days. 01/28/20   Jake BatheSkains, Mark C, MD  HUMALOG 100 UNIT/ML injection SMARTSIG:0-100 Unit(s) SUB-Q Daily  12/24/19   [provider]  Insulin Human (INSULIN PUMP) SOLN bolus as directed 4-6 times per day 09/30/14   Creola Cornusso, John, MD  losartan (COZAAR) 50 MG tablet Take 50 mg by mouth daily. 12/31/19   [provider]  nitroGLYCERIN (NITROSTAT) 0.4 MG SL tablet Place 1 tablet (0.4 mg total) under the tongue every 5 (five) minutes as needed for chest pain. 09/30/14   Janetta Horahompson, Kathryn R, PA-C  ONE TOUCH ULTRA TEST test strip CHECK BLOOD SUGAR 3X A DAY. DX CODE-E10.65 12/20/17   [provider]  rosuvastatin (CRESTOR) 40 MG tablet Take 40 mg by mouth daily.    [provider]    Allergies    Sulfa antibiotics  Review of Systems   Review of Systems  Constitutional: Positive for appetite change and fatigue. Negative for activity change, chills, diaphoresis, fever and unexpected weight change.  HENT: Negative.   Respiratory: Negative.   Cardiovascular: Negative.   Gastrointestinal: Positive for nausea and vomiting. Negative for abdominal distention, abdominal pain, anal bleeding, blood in stool, constipation, diarrhea and rectal pain.  Genitourinary: Negative.   Musculoskeletal: Negative.   Skin: Negative.   Neurological: Negative for dizziness, tremors, syncope, speech difficulty, weakness, light-headedness, numbness and headaches.       "Shaky"  All other systems reviewed and are negative.   Physical Exam Updated Vital Signs BP (!) 164/85   Pulse 95   Temp 98.7 F (37.1 C) (Oral)   Resp 19   SpO2 100%   Physical Exam Vitals and nursing note reviewed.  Constitutional:      General: He is not in acute distress.    Appearance: He is well-developed and well-nourished. He is not ill-appearing, toxic-appearing or diaphoretic.  HENT:     Head: Normocephalic and atraumatic.     Nose: Nose normal.     Mouth/Throat:     Mouth: Mucous membranes are dry.  Eyes:     Pupils: Pupils are equal, round, and reactive to light.  Cardiovascular:     Rate and Rhythm:  Normal rate and regular rhythm.     Pulses: Normal pulses.          Radial pulses are 2+ on the right side and 2+ on the left side.       Dorsalis pedis pulses are 2+ on the right side and 2+ on the left side.     Heart sounds: Normal heart sounds.  Pulmonary:     Effort: Pulmonary effort is normal. No respiratory distress.     Breath sounds: Normal breath sounds.  Abdominal:     General: Bowel sounds are normal. There is no distension.     Palpations: Abdomen is soft.     Tenderness: There is no abdominal tenderness. There is no right CVA tenderness, left CVA tenderness, guarding or rebound.     Hernia: No hernia is present.  Musculoskeletal:        General: Normal range of motion.  Cervical back: Normal range of motion and neck supple.  Skin:    General: Skin is warm and dry.     Capillary Refill: Capillary refill takes less than 2 seconds.  Neurological:     General: No focal deficit present.     Mental Status: He is alert and oriented to person, place, and time.     Cranial Nerves: Cranial nerves are intact.     Sensory: Sensation is intact.     Motor: Motor function is intact.     Gait: Gait is intact.     Comments: CN 2-12 grossly intact Ambulatory without difficulty  Psychiatric:        Mood and Affect: Mood and affect normal.    ED Results / Procedures / Treatments   Labs (all labs ordered are listed, but only abnormal results are displayed) Labs Reviewed  BASIC METABOLIC PANEL - Abnormal; Notable for the following components:      Result Value   CO2 13 (*)    Glucose, Bld 251 (*)    BUN 25 (*)    Creatinine, Ser 1.31 (*)    Anion gap 22 (*)    All other components within normal limits  CBC - Abnormal; Notable for the following components:   WBC 17.7 (*)    HCT 52.5 (*)    All other components within normal limits  URINALYSIS, ROUTINE W REFLEX MICROSCOPIC - Abnormal; Notable for the following components:   Specific Gravity, Urine 1.032 (*)    Glucose, UA  >=500 (*)    Hgb urine dipstick MODERATE (*)    Ketones, ur 80 (*)    Protein, ur 100 (*)    Bacteria, UA RARE (*)    All other components within normal limits  BASIC METABOLIC PANEL - Abnormal; Notable for the following components:   Sodium 131 (*)    CO2 14 (*)    Glucose, Bld 279 (*)    BUN 29 (*)    Anion gap 18 (*)    All other components within normal limits  BLOOD GAS, VENOUS - Abnormal; Notable for the following components:   pCO2, Ven 34.6 (*)    pO2, Ven 31.7 (*)    Bicarbonate 15.3 (*)    Acid-base deficit 10.5 (*)    All other components within normal limits  BETA-HYDROXYBUTYRIC ACID - Abnormal; Notable for the following components:   Beta-Hydroxybutyric Acid 6.22 (*)    All other components within normal limits  CBG MONITORING, ED - Abnormal; Notable for the following components:   Glucose-Capillary 256 (*)    All other components within normal limits  CBG MONITORING, ED - Abnormal; Notable for the following components:   Glucose-Capillary 257 (*)    All other components within normal limits  CBG MONITORING, ED - Abnormal; Notable for the following components:   Glucose-Capillary 250 (*)    All other components within normal limits  SARS CORONAVIRUS 2 (TAT 6-24 HRS)  BASIC METABOLIC PANEL  BASIC METABOLIC PANEL  BASIC METABOLIC PANEL  BASIC METABOLIC PANEL    EKG EKG Interpretation  Date/Time:  Tuesday December 29 2020 09:29:06 EST Ventricular Rate:  93 PR Interval:    QRS Duration: 91 QT Interval:  356 QTC Calculation: 443 R Axis:   23 Text Interpretation: Sinus rhythm Confirmed by Virgina NorfolkAdam, [place] [phone]([id number]) on 12/29/2020 9:39:00 AM   Radiology No results found.  Procedures .Critical Care Performed by: Linwood DibblesHenderly, Theressa Piedra A, PA-C Authorized by: Linwood DibblesHenderly, Tajuan Dufault A, PA-C   Critical care  provider statement:    Critical care time (minutes):  45   Critical care was necessary to treat or prevent imminent or life-threatening deterioration of the following  conditions:  Endocrine crisis   Critical care was time spent personally by me on the following activities:  Discussions with consultants, evaluation of patient's response to treatment, examination of patient, ordering and performing treatments and interventions, ordering and review of laboratory studies, ordering and review of radiographic studies, pulse oximetry, re-evaluation of patient's condition, obtaining history from patient or surrogate and review of old charts     Medications Ordered in ED Medications  lactated ringers bolus 20 mL/kg (has no administration in time range)  insulin regular, human (MYXREDLIN) 100 units/ 100 mL infusion (7.5 Units/hr Intravenous New Bag/Given 12/29/20 0946)  lactated ringers infusion (has no administration in time range)  dextrose 5 % in lactated ringers infusion (has no administration in time range)  dextrose 50 % solution 0-50 mL (has no administration in time range)  potassium chloride 10 mEq in 100 mL IVPB (10 mEq Intravenous New Bag/Given 12/29/20 0947)  sodium chloride 0.9 % bolus 2,000 mL (2,000 mLs Intravenous New Bag/Given 12/29/20 0756)  ondansetron (ZOFRAN) injection 4 mg (4 mg Intravenous Given 12/29/20 0756)   ED Course  I have reviewed the triage vital signs and the nursing notes.  Pertinent labs & imaging results that were available during my care of the patient were reviewed by me and considered in my medical decision making (see chart for details).  Unfortunately patient had extended wait of greater than 10 hours in the waiting room prior to being roomed for a provider to evaluate patient.  Type I diabetic presents for evaluation of hyperglycemia.  History of DKA states it feels similar.  Had tried bolusing 40 units of insulin at home yesterday however persistent hyperglycemia.  Heart and lungs are clear.  Abdomen is soft, nontender.  Non focal neuro exam without deficits. Work-up started from triage, I personally viewed interpreted.  CBC  leukocytosis at 17.7, no infectious symptoms Metabolic panel with hyperglycemia to 251, CO2 13, creatinine 1.31, anion gap 22  Given patient's labs were obtained approximately 10 hours prior to my assessment we will repeat labs and add VBG, BHA, start aggressive fluid bolus.  Patient has removed his insulin pump.  Patient's repeat labs consistent with DKA.    Beta hydroxy 6.22 Metabolic panel with persistent hyperglycemia, anion gap, low bicarb UA consistent with ketonuria, proteinuria.  No evidence of infectious process. COVID pending on admission, No Sx consistent with COVID infection.  Patient has normal mentation.  Stable blood pressures.  He is getting 2 L of fluids.  Started on insulin drip with glucose stabilizer.  Will be admitted for further management.  CONSULT with Dr. Isidoro Donning with [facility] who agrees to evalaute patient for admission.  The patient appears reasonably stabilized for admission considering the current resources, flow, and capabilities available in the ED at this time, and I doubt any other [facility] requiring further screening and/or treatment in the ED prior to admission.    MDM Rules/Calculators/A&P                           Final Clinical Impression(s) / ED Diagnoses Final diagnoses:  Diabetic ketoacidosis without coma associated with type 1 diabetes mellitus (HCC)  AKI (acute kidney injury) (HCC)    Rx / DC Orders ED Discharge Orders    None  Kenetha Cozza A, PA-C 12/29/20 0954    Virgina Norfolk, DO 12/29/20 [phone]

## 2020-12-29 NOTE — ED Notes (Signed)
Spoke with Dr Isidoro Donning regarding patient's most recent blood sugars; MD advised to continue insulin drip and recollect BMP.

## 2020-12-29 NOTE — H&P (Signed)
History and Physical        Hospital Admission Note Date: 12/29/2020  Patient name: Ian Houston Medical record number: [id number] Date of birth: 10/28/1961 Age: 60 y.o. Gender: male  PCP: Creola Corn, MD    Patient coming from: Home  I have reviewed all records in the [facility].    Chief Complaint:  Hyperglycemia with question of insulin pump malfunctioning, nausea and vomiting, diarrhea  HPI: Patient is a 60 year old male with history of type 1 diabetes mellitus on insulin pump, CAD, hypertension, hyperlipidemia presented due to elevated blood sugars.  Patient reported around 8 AM yesterday he started feeling nauseous and sick.  He checked his blood sugars at home, was in high 300s.  He had multiple episodes (~5-6) of vomiting, 2 episodes of diarrhea, not able to eat.  He also felt shaky and symptoms similar to his past history of DKA.  Per patient, he had an episode of DKA in 2015 when his insulin pump was malfunctioning.  He changed the site of the insulin pump with no improvement in blood sugars.  Over the course of the day, patient gave himself at least 35 to 40 units of insulin, however the blood sugars persistently remained in 300s. No chest pain, shortness of breath, hematemesis, hematochezia or melena.  No abdominal pain. States he had no Covid exposures and he is fully vaccinated including the booster.  ED work-up/course:  In ED, temp 98.7, pulse 107, BP 159/90, UA positive for ketones Sodium 135, bicarb 13, anion gap 22, creatinine 1.3 at the time of admission BHB 6.22 WBC 17.7, hemoglobin 17.0  Review of Systems: Positives marked in 'bold' Constitutional: Denies fever, + chills, diaphoresis, poor appetite and fatigue.  HEENT: Denies photophobia, eye pain, redness, hearing loss, ear pain, congestion, sore throat, rhinorrhea, sneezing, mouth sores, trouble  swallowing, neck pain, neck stiffness and tinnitus.   Respiratory: Denies SOB, DOE, cough, chest tightness,  and wheezing.   Cardiovascular: Denies chest pain, palpitations and leg swelling.  Gastrointestinal: Please see HPI Genitourinary: Denies dysuria, urgency, hematuria, flank pain and difficulty urinating. + Increased frequency Musculoskeletal: Denies myalgias, back pain, joint swelling, arthralgias and gait problem.  Skin: Denies pallor, rash and wound.  Neurological: Denies dizziness, seizures, syncope, light-headedness, numbness and headaches. + Generalized weakness Hematological: Denies adenopathy. Easy bruising, personal or family bleeding history  Psychiatric/Behavioral: Denies suicidal ideation, mood changes, confusion, nervousness, sleep disturbance and agitation  Past Medical History: Past Medical History:  Diagnosis Date  . CAD (coronary artery disease)    a. s/p NSTEMI in the setting of DKA 10/15 >>> LHC (09/29/14):  Mid LAD 90%, proximal D1 90% (small), proximal RCA 20-30%, EF 60% >> PCI:  24 mm x 3.0 Promus Premier DES to LAD.  Marland Kitchen Complication of anesthesia    states difficult to wake up  . Diabetes mellitus without complication (HCC)   . GERD (gastroesophageal reflux disease)   . History of echocardiogram    a.  Echo (10/15):  Vigorous LVF, EF 65-70%, normal wall motion, mild MR  . Hyperlipidemia   . Hypertension   . Tobacco abuse    a. chewing tobacco    Past Surgical  History:  Procedure Laterality Date  . ANTERIOR CERVICAL DECOMP/DISCECTOMY FUSION N/A 04/29/2016   Procedure: CERVICAL FOUR-FIVE ANTERIOR CERVICAL DECOMPRESSION/DISKECTOMY/FUSION;  Surgeon: Hilda Lias, MD;  Location: [facility];  Service: Neurosurgery;  Laterality: N/A;  . BACK SURGERY  2011  . LEFT HEART CATHETERIZATION WITH CORONARY ANGIOGRAM N/A 09/29/2014   Procedure: LEFT HEART CATHETERIZATION WITH CORONARY ANGIOGRAM;  Surgeon: Lesleigh Noe, MD;  Location: [facility] CATH LAB;  Service:  Cardiovascular;  Laterality: N/A;  . NASAL SEPTUM SURGERY  1998?    Medications: Prior to Admission medications   Medication Sig Start Date End Date Taking? Authorizing Provider  aspirin EC 81 MG tablet Take 1 tablet (81 mg total) by mouth daily. 01/03/19  Yes Jake Bathe, MD  Evolocumab (REPATHA SURECLICK) 140 MG/ML SOAJ Inject 1 pen into the skin every 14 (fourteen) days. 01/28/20  Yes Jake Bathe, MD  HUMALOG 100 UNIT/ML injection SMARTSIG:0-100 Unit(s) SUB-Q Daily 12/24/19  Yes [provider]  ibuprofen (ADVIL) 200 MG tablet Take 400 mg by mouth every 6 (six) hours as needed.   Yes [provider]  Insulin Human (INSULIN PUMP) SOLN bolus as directed 4-6 times per day 09/30/14  Yes Creola Corn, MD  losartan (COZAAR) 50 MG tablet Take 50 mg by mouth daily. 12/31/19  Yes [provider]  nitroGLYCERIN (NITROSTAT) 0.4 MG SL tablet Place 1 tablet (0.4 mg total) under the tongue every 5 (five) minutes as needed for chest pain. 09/30/14  Yes Janetta Hora, PA-C  rosuvastatin (CRESTOR) 40 MG tablet Take 40 mg by mouth daily.   Yes [provider]  ONE TOUCH ULTRA TEST test strip CHECK BLOOD SUGAR 3X A DAY. DX CODE-E10.65 12/20/17   [provider]    Allergies:   Allergies  Allergen Reactions  . Sulfa Antibiotics Other (See Comments)    Flu like symptoms    Social History:  reports that he has been smoking cigars. He has smoked for the past 20.00 years. He has quit using smokeless tobacco.  His smokeless tobacco use included chew. He reports current alcohol use of about 14.0 standard drinks of alcohol per week. He reports that he does not use drugs.  Family History: Family History  Problem Relation Age of Onset  . Emphysema Mother   . Leukemia Paternal Grandmother     Physical Exam: Blood pressure (!) 164/85, pulse 95, temperature 98.7 F (37.1 C), temperature source Oral, resp. rate 19, SpO2 100 %. General: Alert, awake,  oriented x3, in no acute distress. Eyes: pink conjunctiva,anicteric sclera, pupils equal and reactive to light and accomodation, dry mucosal membranes HEENT: normocephalic, atraumatic, oropharynx clear Neck: supple, no masses or lymphadenopathy, no goiter, no bruits, no JVD CVS: Regular rate and rhythm, without murmurs, rubs or gallops. No lower extremity edema Resp : Clear to auscultation bilaterally, no wheezing, rales or rhonchi. GI : Soft, nontender, nondistended, positive bowel sounds, no masses. No hepatomegaly. No hernia.  Musculoskeletal: No clubbing or cyanosis, positive pedal pulses. No contracture. ROM intact  Neuro: Grossly intact, no focal neurological deficits, strength 5/5 upper and lower extremities bilaterally Psych: alert and oriented x 3, normal mood and affect Skin: no rashes or lesions, warm and dry   LABS on Admission: I have personally reviewed all the labs and imagings below    Basic Metabolic Panel: Recent Labs  Lab 12/28/20 2146 12/29/20 0753  NA 135 131*  K 4.8 4.9  CL 100 99  CO2 13* 14*  GLUCOSE 251*  279*  BUN 25* 29*  CREATININE 1.31* 1.22  CALCIUM 9.5 9.2   Liver Function Tests: No results for input(s): AST, ALT, ALKPHOS, BILITOT, PROT, ALBUMIN in the last 168 hours. No results for input(s): LIPASE, AMYLASE in the last 168 hours. No results for input(s): AMMONIA in the last 168 hours. CBC: Recent Labs  Lab 12/28/20 2146  WBC 17.7*  HGB 17.0  HCT 52.5*  MCV 91.5  PLT 375   Cardiac Enzymes: No results for input(s): CKTOTAL, CKMB, CKMBINDEX, TROPONINI in the last 168 hours. BNP: Invalid input(s): POCBNP CBG: Recent Labs  Lab 12/29/20 0409 12/29/20 0923  GLUCAP 257* 250*    Radiological Exams on Admission:  No results found.    EKG: Independently reviewed.  Rate 93, normal sinus rhythm, no acute ST-T wave changes suggestive of ischemia.   Assessment/Plan Principal Problem:   DKA (diabetic ketoacidosis) (HCC) in the setting  of type 1 diabetes mellitus on insulin pump -Possibly due to insulin pump malfunctioning, similar episode in 2015. -Placed on DKA protocol with insulin infusion and aggressive IV fluid hydration -Follow hemoglobin A1c -Follow serial BMET, once gap is closed, BHB normal, bicarb 18-20, CBGT <180 x4, will then transition to subcu insulin.  -Diabetic coordinator consult placed.  Patient has concern for possible insulin pump malfunctioning  Active Problems:   Coronary artery disease involving native coronary artery of native heart without angina pectoris -Currently no chest pain or shortness of breath, continue aspirin, statin    Hyperlipidemia -Continue rosuvastatin 40 mg daily    HTN (hypertension) -Hold off on losartan today, will place on IV hydralazine as needed with parameters    GERD (gastroesophageal reflux disease) -Placed on PPI    AKI (acute kidney injury) (HCC) -Likely prerenal secondary to DKA, nausea vomiting and diarrhea,  - creatinine 1.3 at the time of admission, hold off on losartan today -Continue IV fluid hydration  Nausea, vomiting, diarrhea -Currently improving, no abdominal pain, fevers.  Follow closely for further work-up as needed -Continue antiemetics, IV fluids  DVT prophylaxis: Lovenox  CODE STATUS: Full CODE STATUS, discussed with the patient  Consults called: None  Family Communication: Admission, patients condition and plan of care including tests being ordered have been discussed with the patient who indicates understanding and agree with the plan and Code Status  Admission status: SDU inpatient  The medical decision making on this patient was of high complexity and the patient is at high risk for clinical deterioration, therefore this is a level 3 admission.  Severity of Illness:      The appropriate patient status for this patient is INPATIENT. Inpatient status is judged to be reasonable and necessary in order to provide the required intensity of  service to ensure the patient's safety. The patient's presenting symptoms, physical exam findings, and initial radiographic and laboratory data in the context of their chronic comorbidities is felt to place them at high risk for further clinical deterioration. Furthermore, it is not anticipated that the patient will be medically stable for discharge from the hospital within 2 midnights of admission. The following factors support the patient status of inpatient.   " The patient's presenting symptoms include DKA, acute kidney injury, dehydration. " The worrisome physical exam findings include dehydration, dry mucous membranes " The initial radiographic and laboratory data are worrisome because of *UA positive for ketones, elevated BHB, acute kidney injury. " The chronic co-morbidities include prior history of DKA, type 1 diabetes mellitus on insulin pump   * I certify  that at the point of admission it is my clinical judgment that the patient will require inpatient hospital care spanning beyond 2 midnights from the point of admission due to high intensity of service, high risk for further deterioration and high frequency of surveillance required.*    Time Spent on Admission: 70 minutes     Tennis Mckinnon M.D. [facility] 12/29/2020, 10:34 AM

## 2020-12-30 DIAGNOSIS — I10 Essential (primary) hypertension: Secondary | ICD-10-CM

## 2020-12-30 DIAGNOSIS — R112 Nausea with vomiting, unspecified: Secondary | ICD-10-CM

## 2020-12-30 DIAGNOSIS — N179 Acute kidney failure, unspecified: Secondary | ICD-10-CM | POA: Diagnosis not present

## 2020-12-30 DIAGNOSIS — E109 Type 1 diabetes mellitus without complications: Secondary | ICD-10-CM

## 2020-12-30 DIAGNOSIS — E0810 Diabetes mellitus due to underlying condition with ketoacidosis without coma: Secondary | ICD-10-CM | POA: Diagnosis not present

## 2020-12-30 LAB — BASIC METABOLIC PANEL
Anion gap: 11 (ref 5–15)
Anion gap: 8 (ref 5–15)
Anion gap: 10 (ref 5–15)
BUN: 17 mg/dL (ref 6–20)
BUN: 14 mg/dL (ref 6–20)
BUN: 16 mg/dL (ref 6–20)
CO2: 19 mmol/L — ABNORMAL LOW (ref 22–32)
CO2: 20 mmol/L — ABNORMAL LOW (ref 22–32)
CO2: 21 mmol/L — ABNORMAL LOW (ref 22–32)
Calcium: 8.6 mg/dL — ABNORMAL LOW (ref 8.9–10.3)
Calcium: 8.5 mg/dL — ABNORMAL LOW (ref 8.9–10.3)
Calcium: 8.8 mg/dL — ABNORMAL LOW (ref 8.9–10.3)
Chloride: 104 mmol/L (ref 98–111)
Chloride: 105 mmol/L (ref 98–111)
Chloride: 104 mmol/L (ref 98–111)
Creatinine, Ser: 0.86 mg/dL (ref 0.61–1.24)
Creatinine, Ser: 0.84 mg/dL (ref 0.61–1.24)
Creatinine, Ser: 0.88 mg/dL (ref 0.61–1.24)
GFR, Estimated: >60 mL/min (ref >60)
GFR, Estimated: >60 mL/min (ref >60)
GFR, Estimated: >60 mL/min (ref >60)
Glucose, Bld: 210 mg/dL — ABNORMAL HIGH (ref 70–99)
Glucose, Bld: 140 mg/dL — ABNORMAL HIGH (ref 70–99)
Glucose, Bld: 90 mg/dL (ref 70–99)
Potassium: 4.2 mmol/L (ref 3.5–5.1)
Potassium: 3.9 mmol/L (ref 3.5–5.1)
Potassium: 3.7 mmol/L (ref 3.5–5.1)
Sodium: 134 mmol/L — ABNORMAL LOW (ref 135–145)
Sodium: 133 mmol/L — ABNORMAL LOW (ref 135–145)
Sodium: 135 mmol/L (ref 135–145)

## 2020-12-30 LAB — GLUCOSE, CAPILLARY
Glucose-Capillary: 165 mg/dL — ABNORMAL HIGH (ref 70–99)
Glucose-Capillary: 192 mg/dL — ABNORMAL HIGH (ref 70–99)
Glucose-Capillary: 207 mg/dL — ABNORMAL HIGH (ref 70–99)

## 2020-12-30 LAB — CBG MONITORING, ED
Glucose-Capillary: 135 mg/dL — ABNORMAL HIGH (ref 70–99)
Glucose-Capillary: 151 mg/dL — ABNORMAL HIGH (ref 70–99)
Glucose-Capillary: 173 mg/dL — ABNORMAL HIGH (ref 70–99)
Glucose-Capillary: 189 mg/dL — ABNORMAL HIGH (ref 70–99)
Glucose-Capillary: 201 mg/dL — ABNORMAL HIGH (ref 70–99)
Glucose-Capillary: 43 mg/dL — CL (ref 70–99)
Glucose-Capillary: 53 mg/dL — ABNORMAL LOW (ref 70–99)
Glucose-Capillary: 94 mg/dL (ref 70–99)

## 2020-12-30 LAB — BETA-HYDROXYBUTYRIC ACID: Beta-Hydroxybutyric Acid: 1.95 mmol/L — ABNORMAL HIGH (ref 0.05–0.27)

## 2020-12-30 MED ORDER — INSULIN ASPART 100 UNIT/ML ~~LOC~~ SOLN
0.0000 [IU] | SUBCUTANEOUS | Status: DC
  Administered 2020-12-30 (×2): 5 [IU] via SUBCUTANEOUS
  Administered 2020-12-30: 3 [IU] via SUBCUTANEOUS
  Administered 2020-12-31: 2 [IU] via SUBCUTANEOUS
  Administered 2020-12-31: 3 [IU] via SUBCUTANEOUS
  Administered 2020-12-31: 5 [IU] via SUBCUTANEOUS
  Filled 2020-12-30: qty 0.15

## 2020-12-30 MED ORDER — INSULIN GLARGINE 100 UNIT/ML ~~LOC~~ SOLN
40.0000 [IU] | Freq: Every day | SUBCUTANEOUS | Status: DC
  Administered 2020-12-30: 40 [IU] via SUBCUTANEOUS
  Filled 2020-12-30 (×2): qty 0.4

## 2020-12-30 MED ORDER — INSULIN REGULAR(HUMAN) IN NACL 100-0.9 UT/100ML-% IV SOLN
INTRAVENOUS | Status: DC
  Administered 2020-12-30: 1.9 [IU]/h via INTRAVENOUS

## 2020-12-30 MED ORDER — LOSARTAN POTASSIUM 50 MG PO TABS
25.0000 mg | ORAL_TABLET | Freq: Every day | ORAL | Status: DC
  Administered 2020-12-30 – 2020-12-31 (×2): 25 mg via ORAL
  Filled 2020-12-30 (×2): qty 1

## 2020-12-30 NOTE — ED Notes (Signed)
On call provider contacted to inquire about transitioning patient to subq insulin

## 2020-12-30 NOTE — ED Notes (Signed)
Provided pt 4oz orange juice

## 2020-12-30 NOTE — Progress Notes (Addendum)
Inpatient Diabetes Program Recommendations  AACE/ADA: New Consensus Statement on Inpatient Glycemic Control (2015)  Target Ranges:  Prepandial:   less than 140 mg/dL      Peak postprandial:   less than 180 mg/dL (1-2 hours)      Critically ill patients:  140 - 180 mg/dL   Lab Results  Component Value Date   GLUCAP 53 (L) 12/30/2020   HGBA1C 8.0 (H) 12/29/2020    Review of Glycemic Control Results for Ian Houston, Ian Houston (MRN [id number]) as of 12/30/2020 09:58  Ref. Range 12/30/2020 00:28 12/30/2020 01:29 12/30/2020 04:18 12/30/2020 05:37 12/30/2020 08:12  Glucose-Capillary Latest Ref Range: 70 - 99 mg/dL 025 (H) 427 (H) 43 (LL) 94 53 (L)   Diabetes history:  T1DM Outpatient Diabetes medications:  Medtronic insulin pump and CGM with Novolog Basal 49.625/24hr Carb ratio 1 units for 8 carbs ISF 45 Active insulin time  3hr Target  120-120 Current orders for Inpatient glycemic control:  Novolog 0-15 units Q4H Lantus 40 units QHS  Inpatient Diabetes Program Recommendations:     Please consider:  -Holding Lantus this evening (needs to be 24 hrs from last dose before placing back on insulin pump; was given Lantus at 0156 this morning) -Novolog 0-9 units Q4H -Place back on insulin pump first thing tomorrow morning.  Then DC all basal bolus orders and place insulin pump order set  Spoke with patient and he is aware of Lantus administration at 0156 this morning and will either need to suspend basal insulin or wait until at least 24 hr from then to replace pump.   Will continue to follow while inpatient.  Thank you, Dulce Sellar, RN, BSN Diabetes Coordinator Inpatient Diabetes Program [phone] (team pager from 8a-5p)

## 2020-12-30 NOTE — Progress Notes (Signed)
PROGRESS NOTE   TORON BOWRING  UDJ:[id number] DOB: [birth year] DOA: 12/29/2020 PCP: Creola Corn, MD  Brief Narrative:  60 year old white male DM TY 1 with insulin pump NSTEMI in setting of DKA 2015 status post angioplasty on Brilinta aspirin in the past Previous cervical stenosis C4-C5 ACDF  Patient started feeling poorly 1/24 sugars have been ranging high 300s-self medicated with insulin at home 40 units extra Found to have anion gap 22 bicarb 13 White count 17 hemoglobin 17   Assessment & Plan:   Principal Problem:   DKA (diabetic ketoacidosis) (HCC) Active Problems:   Coronary artery disease involving native coronary artery of native heart without angina pectoris   Hyperlipidemia   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 DKA a. Patient had mild DKA on admission which is now resolved and has been transitioned to sliding scale b. He has an insulin pump which will need to be adjusted and seen by diabetic coordinator otherwise will need basal bolus insulin c. Can transition to a diabetic diet and transfer to MedSurg 2. AKI on admission a. Which improved from admission-saline lock b. Repeat labs a.m. 3. HTN a. Prior to admission was on Cozaar 50 daily which was held for AKI b. Resume at lower dose at 25 and reassess trends 4. Prior history NSTEMI 2015 a. Not on aspirin?  Beta-blocker 5. Nausea vomiting 6. HLD  DVT prophylaxis: Lovenox Code Status: Full Family Communication: None Disposition:  Status is: Inpatient  Remains inpatient appropriate because:Hemodynamically unstable and Persistent severe electrolyte disturbances   Dispo: The patient is from: Home              Anticipated d/c is to: Home              Anticipated d/c date is: 1 day              Patient currently is not medically stable to d/c.   Difficult to place patient  No       Consultants:   n  Procedures: n  Antimicrobials: n    Subjective: Patient overall feels fair no distress no nausea vomiting currently Tells me is quite comfortable giving himself insulin subcu  Objective: Vitals:   12/29/20 2323 12/29/20 2330 12/30/20 0200 12/30/20 0500  BP: 139/83 (!) 146/73 121/71 119/69  Pulse: 97 76 82 86  Resp: 16  14 14   Temp:      TempSrc:      SpO2: 97% 95% 96% 97%  Weight:      Height:        Intake/Output Summary (Last 24 hours) at 12/30/2020 0703 Last data filed at 12/30/2020 0135 Gross per 24 hour  Intake 100 ml  Output -  Net 100 ml   Filed Weights   12/29/20 2040  Weight: 100.2 kg    Examination:  Awake alert coherent no distress no icterus no pallor no submandibular lymphadenopathy Chest clear no added sound Abdomen soft no rebound no guarding No lower extremity edema ROM intact Neurologically intact no focal deficit moving all 4 limbs equally sensory intact  Data Reviewed: I have personally reviewed following labs and imaging studies  BUNs/creatinine on admission 25/1.3-->16/0.8 WBC 17.7-->14.3 Hemoglobin 15   COVID-19 Labs  No results for input(s): DDIMER, FERRITIN, LDH, CRP in the last 72 hours.  Lab Results  Component Value Date   SARSCOV2NAA NEGATIVE 12/29/2020  Radiology Studies: No results found.   Scheduled Meds: . aspirin EC  81 mg Oral Daily  . enoxaparin (LOVENOX) injection  40 mg Subcutaneous Q24H  . insulin aspart  0-15 Units Subcutaneous Q4H  . insulin glargine  40 Units Subcutaneous QHS  . pantoprazole  40 mg Oral Q0600  . rosuvastatin  40 mg Oral Daily   Continuous Infusions: . dextrose 5% lactated ringers 125 mL/hr at 12/29/20 1326  . lactated ringers Stopped (12/29/20 1326)     LOS: 1 day    Time spent: 41  Rhetta Mura, MD [facility] To contact the attending provider between 7A-7P or the covering provider during after hours 7P-7A, please log into  the web site [email or web address] and access using universal [place] password for that web site. If you do not have the password, please call the hospital operator.  12/30/2020, 7:03 AM

## 2020-12-30 NOTE — ED Notes (Signed)
Provided pt with 8oz apple juice. Pt A&O

## 2020-12-30 NOTE — Progress Notes (Signed)
Patient had a visit from his wife who brought him a fast food meal. RN reiterated carb modified diet and reason for patient being admitted to hospital and patient said he would be having just his burger and wouldn't be eating any fries. Patient also had a medium sized drink that he stated was a diet coke. RN will frequently reiterate and educate patient on the  importance of diet modifications in type one diabetics.Will continue with current treatment plan and update as necessary.

## 2020-12-30 NOTE — ED Notes (Signed)
Insulin drip to be stopped at 4am, which is 2 hours post subq lantus administration

## 2020-12-31 DIAGNOSIS — E0810 Diabetes mellitus due to underlying condition with ketoacidosis without coma: Secondary | ICD-10-CM | POA: Diagnosis not present

## 2020-12-31 DIAGNOSIS — E785 Hyperlipidemia, unspecified: Secondary | ICD-10-CM

## 2020-12-31 DIAGNOSIS — K219 Gastro-esophageal reflux disease without esophagitis: Secondary | ICD-10-CM

## 2020-12-31 LAB — CBC WITH DIFFERENTIAL/PLATELET
Abs Immature Granulocytes: 0.02 10*3/uL (ref 0.00–0.07)
Basophils Absolute: 0 10*3/uL (ref 0.0–0.1)
Basophils Relative: 1 %
Eosinophils Absolute: 0.1 10*3/uL (ref 0.0–0.5)
Eosinophils Relative: 2 %
HCT: 43 % (ref 39.0–52.0)
Hemoglobin: 14.6 g/dL (ref 13.0–17.0)
Immature Granulocytes: 0 %
Lymphocytes Relative: 27 %
Lymphs Abs: 1.6 10*3/uL (ref 0.7–4.0)
MCH: 29.8 pg (ref 26.0–34.0)
MCHC: 34 g/dL (ref 30.0–36.0)
MCV: 87.8 fL (ref 80.0–100.0)
Monocytes Absolute: 0.6 10*3/uL (ref 0.1–1.0)
Monocytes Relative: 10 %
Neutro Abs: 3.7 10*3/uL (ref 1.7–7.7)
Neutrophils Relative %: 60 %
Platelets: 165 10*3/uL (ref 150–400)
RBC: 4.9 MIL/uL (ref 4.22–5.81)
RDW: 13 % (ref 11.5–15.5)
WBC: 6.1 10*3/uL (ref 4.0–10.5)
nRBC: 0 % (ref 0.0–0.2)

## 2020-12-31 LAB — COMPREHENSIVE METABOLIC PANEL
ALT: 43 U/L (ref 0–44)
AST: 43 U/L — ABNORMAL HIGH (ref 15–41)
Albumin: 3.2 g/dL — ABNORMAL LOW (ref 3.5–5.0)
Alkaline Phosphatase: 61 U/L (ref 38–126)
Anion gap: 10 (ref 5–15)
BUN: 13 mg/dL (ref 6–20)
CO2: 22 mmol/L (ref 22–32)
Calcium: 8.5 mg/dL — ABNORMAL LOW (ref 8.9–10.3)
Chloride: 103 mmol/L (ref 98–111)
Creatinine, Ser: 0.63 mg/dL (ref 0.61–1.24)
GFR, Estimated: >60 mL/min (ref >60)
Glucose, Bld: 162 mg/dL — ABNORMAL HIGH (ref 70–99)
Potassium: 3.7 mmol/L (ref 3.5–5.1)
Sodium: 135 mmol/L (ref 135–145)
Total Bilirubin: 1.2 mg/dL (ref 0.3–1.2)
Total Protein: 5.9 g/dL — ABNORMAL LOW (ref 6.5–8.1)

## 2020-12-31 LAB — GLUCOSE, CAPILLARY
Glucose-Capillary: 138 mg/dL — ABNORMAL HIGH (ref 70–99)
Glucose-Capillary: 202 mg/dL — ABNORMAL HIGH (ref 70–99)

## 2020-12-31 MED ORDER — INSULIN PUMP
SUBCUTANEOUS | Status: DC
  Filled 2020-12-31: qty 1

## 2020-12-31 NOTE — Discharge Summary (Signed)
Physician Discharge Summary  Ian Houston ZOX:[id number] DOB: [birth year] DOA: 12/29/2020  PCP: Creola Corn, MD  Admit date: 12/29/2020 Discharge date: 12/31/2020  Time spent: 26 minutes  Recommendations for Outpatient Follow-up:  1. Needs Chem-12 in about 1 week 2. 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)   HTN (hypertension)   GERD (gastroesophageal reflux disease)   AKI (acute kidney injury) (HCC)   Discharge Condition: Significantly improved  Diet recommendation: Diabetic  Filed Weights   12/29/20 2040  Weight: 100.2 kg    History of present illness:  60 year old white male DM TY 1 with insulin pump NSTEMI in setting of DKA 2015 status post angioplasty on Brilinta aspirin in the past Previous cervical stenosis C4-C5 ACDF  Patient started feeling poorly 1/24 sugars have been ranging high 300s-self medicated with insulin at home 40 units extra Found to have anion gap 22 bicarb 13 White count 17 hemoglobin 17  Patient had mild DKA on admission was transitioned to sliding scale-assistance was obtained from diabetic coordinator who helped transition back to insulin pump which was confirmed through working on discharge He had AKI during hospital stay which resolved and his Cozaar was adjusted downward slightly but resumed on his regular dose on discharge May need consideration for resumption of aspirin and beta-blocker in the outpatient setting  He had significantly stabilized on discharge and was stable to go home   Discharge Exam: Vitals:   12/30/20 2043 12/31/20 0359  BP: 135/86 129/84   Pulse: 65 60  Resp: 14 14  Temp: 98.4 F (36.9 C) 98.6 F (37 C)  SpO2: 94% 95%    General: Awake coherent alert no distress EOMI NCAT sitting up eating breakfast Cardiovascular: S1-S2 no murmur Respiratory: Clear no added sound Neurologically intact moving all 4 limbs  Discharge Instructions   Discharge Instructions    Diet - low sodium heart healthy   Complete by: As directed    Discharge instructions   Complete by: As directed    Make sure u keep your follow-up appointment with Dr. Timothy Lasso in about 1 week and get labs when you are scheduled to see him Continue all your medications without change   Increase activity slowly   Complete by: As directed      Allergies as of 12/31/2020      Reactions   Sulfa Antibiotics Other (See Comments)   Flu like symptoms      Medication List    TAKE these medications   aspirin EC 81 MG tablet Take 1 tablet (81 mg total) by mouth daily.   HumaLOG 100 UNIT/ML injection Generic drug: insulin lispro SMARTSIG:0-100 Unit(s) SUB-Q Daily   ibuprofen 200 MG tablet Commonly known as: ADVIL Take 400 mg by mouth every 6 (six) hours as needed.   insulin pump Soln bolus as directed 4-6 times per day   losartan 50 MG tablet Commonly known as: COZAAR Take 50 mg by mouth daily.   nitroGLYCERIN 0.4 MG SL tablet Commonly known as: NITROSTAT Place 1 tablet (0.4 mg total) under the tongue every 5 (five) minutes as needed for chest pain.   ONE TOUCH ULTRA TEST test strip Generic  drug: glucose blood CHECK BLOOD SUGAR 3X A DAY. DX CODE-E10.65   Repatha SureClick 140 MG/ML Soaj Generic drug: Evolocumab Inject 1 pen into the skin every 14 (fourteen) days.   rosuvastatin 40 MG tablet Commonly known as: CRESTOR Take 40 mg by mouth daily.      Allergies  Allergen Reactions  . Sulfa Antibiotics Other (See Comments)    Flu like symptoms      The results of significant diagnostics from this hospitalization (including imaging,  microbiology, ancillary and laboratory) are listed below for reference.    Significant Diagnostic Studies: No results found.  Microbiology: Recent Results (from the past 240 hour(s))  SARS CORONAVIRUS 2 (TAT 6-24 HRS) Nasopharyngeal Nasopharyngeal Swab     Status: None   Collection Time: 12/29/20  9:49 AM   Specimen: Nasopharyngeal Swab  Result Value Ref Range Status   SARS Coronavirus 2 NEGATIVE NEGATIVE Final    Comment: (NOTE) SARS-CoV-2 target nucleic acids are NOT DETECTED.  The SARS-CoV-2 RNA is generally detectable in upper and lower respiratory specimens during the acute phase of infection. Negative results do not preclude SARS-CoV-2 infection, do not rule out co-infections with other pathogens, and should not be used as the sole basis for treatment or other patient management decisions. Negative results must be combined with clinical observations, patient history, and epidemiological information. The expected result is Negative.  Fact Sheet for Patients: HairSlick.no  Fact Sheet for Healthcare Providers: quierodirigir.com  This test is not yet approved or cleared by the [place] FDA and  has been authorized for detection and/or diagnosis of SARS-CoV-2 by FDA under an Emergency Use Authorization (EUA). This EUA will remain  in effect (meaning this test can be used) for the duration of the COVID-19 declaration under Se ction 564(b)(1) of the Act, 21 U.S.C. section 360bbb-3(b)(1), unless the authorization is terminated or revoked sooner.  Performed at [facility], [address], [place], [place] [address]      Labs: Basic Metabolic Panel: Recent Labs  Lab 12/29/20 1808 12/29/20 2256 12/30/20 0155 12/30/20 0443 12/31/20 0601  NA 132* 134* 133* 135 135  K 5.0 4.2 3.9 3.7 3.7  CL 103 104 105 104 103  CO2 15* 19* 20* 21* 22  GLUCOSE 281* 210* 140* 90 162*  BUN 20 17 14 16 13   CREATININE 1.00  0.86 0.84 0.88 0.63  CALCIUM 8.3* 8.6* 8.5* 8.8* 8.5*   Liver Function Tests: Recent Labs  Lab 12/31/20 0601  AST 43*  ALT 43  ALKPHOS 61  BILITOT 1.2  PROT 5.9*  ALBUMIN 3.2*   No results for input(s): LIPASE, AMYLASE in the last 168 hours. No results for input(s): AMMONIA in the last 168 hours. CBC: Recent Labs  Lab 12/28/20 2146 12/29/20 1536 12/31/20 0601  WBC 17.7* 14.3* 6.1  NEUTROABS  --   --  3.7  HGB 17.0 15.0 14.6  HCT 52.5* 44.2 43.0  MCV 91.5 88.9 87.8  PLT 375 231 165   Cardiac Enzymes: No results for input(s): CKTOTAL, CKMB, CKMBINDEX, TROPONINI in the last 168 hours. BNP: BNP (last 3 results) No results for input(s): BNP in the last 8760 hours.  ProBNP (last 3 results) No results for input(s): PROBNP in the last 8760 hours.  CBG: Recent Labs  Lab 12/30/20 1619 12/30/20 2045 12/30/20 2349 12/31/20 0357 12/31/20 0755  GLUCAP 192* 207* 165* 138* 202*       Signed:  01/02/21 MD   [facility] 12/31/2020, 9:08 AM

## 2021-01-19 ENCOUNTER — Ambulatory Visit: Payer: 59 | Admitting: Cardiology

## 2021-01-19 ENCOUNTER — Other Ambulatory Visit: Payer: Self-pay

## 2021-01-19 ENCOUNTER — Encounter: Payer: Self-pay | Admitting: Cardiology

## 2021-01-19 VITALS — BP 120/70 | HR 67 | Ht 72.0 in | Wt 226.0 lb

## 2021-01-19 DIAGNOSIS — I2510 Atherosclerotic heart disease of native coronary artery without angina pectoris: Secondary | ICD-10-CM | POA: Diagnosis not present

## 2021-01-19 DIAGNOSIS — E1169 Type 2 diabetes mellitus with other specified complication: Secondary | ICD-10-CM

## 2021-01-19 DIAGNOSIS — E785 Hyperlipidemia, unspecified: Secondary | ICD-10-CM | POA: Diagnosis not present

## 2021-01-19 NOTE — Progress Notes (Signed)
Cardiology Office Note:    Date:  01/19/2021   ID:  Ian Houston, DOB 08-17-1961, MRN [id number]  PCP:  Creola Corn, MD   [facility]  Cardiologist:  Donato Schultz, MD  Advanced Practice Provider:  No care team member to display Electrophysiologist:  None       Referring MD: Creola Corn, MD     History of Present Illness:    Ian Houston is a 60 y.o. male with diabetes, DKA, non-ST elevation MI lateral ST changes, catheterization in 2015 treated with LAD stent.  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  Last March 2021 went to [facility] [place] for alcohol use, completed program of 30 days.  Overall been doing fairly well with his shortness of breath. Not very significant. No chest pain, no syncope no bleeding.  Been compliant with his Repatha.  Outside labs show LDL of 34 hemoglobin A1c 7.8 hemoglobin of 16.3, ALT of 85  Past Medical History:  Diagnosis Date  . CAD (coronary artery disease)    a. s/p NSTEMI in the setting of DKA 10/15 >>> LHC (09/29/14):  Mid LAD 90%, proximal D1 90% (small), proximal RCA 20-30%, EF 60% >> PCI:  24 mm x 3.0 Promus Premier DES to LAD.  Marland Kitchen Complication of anesthesia    states difficult to wake up  . Diabetes mellitus without complication (HCC)   . GERD (gastroesophageal reflux disease)   . History of echocardiogram    a.  Echo (10/15):  Vigorous LVF, EF 65-70%, normal wall motion, mild MR  . Hyperlipidemia   . Hypertension   . Tobacco abuse    a. chewing tobacco    Past Surgical History:  Procedure Laterality Date  . ANTERIOR CERVICAL DECOMP/DISCECTOMY FUSION N/A 04/29/2016   Procedure: CERVICAL FOUR-FIVE ANTERIOR CERVICAL DECOMPRESSION/DISKECTOMY/FUSION;  Surgeon: Hilda Lias, MD;  Location: [facility];  Service: Neurosurgery;  Laterality: N/A;  . BACK SURGERY  2011  . LEFT HEART CATHETERIZATION WITH CORONARY ANGIOGRAM N/A 09/29/2014   Procedure: LEFT HEART CATHETERIZATION  WITH CORONARY ANGIOGRAM;  Surgeon: Lesleigh Noe, MD;  Location: [facility] LAB;  Service: Cardiovascular;  Laterality: N/A;  . NASAL SEPTUM SURGERY  1998?    Current Medications: Current Meds  Medication Sig  . aspirin EC 81 MG tablet Take 1 tablet (81 mg total) by mouth daily.  . Evolocumab (REPATHA SURECLICK) 140 MG/ML SOAJ Inject 1 pen into the skin every 14 (fourteen) days.  Marland Kitchen HUMALOG 100 UNIT/ML injection SMARTSIG:0-100 Unit(s) SUB-Q Daily  . ibuprofen (ADVIL) 200 MG tablet Take 400 mg by mouth every 6 (six) hours as needed.  . Insulin Human (INSULIN PUMP) SOLN bolus as directed 4-6 times per day  . losartan (COZAAR) 50 MG tablet Take 50 mg by mouth daily.  . nitroGLYCERIN (NITROSTAT) 0.4 MG SL tablet Place 1 tablet (0.4 mg total) under the tongue every 5 (five) minutes as needed for chest pain.  . ONE TOUCH ULTRA TEST test strip CHECK BLOOD SUGAR 3X A DAY. DX CODE-E10.65  . rosuvastatin (CRESTOR) 40 MG tablet Take 40 mg by mouth daily.     Allergies:   Sulfa antibiotics   Social History   Socioeconomic History  . Marital status: Married    Spouse name: Not on file  . Number of children: Not on file  . Years of education: Not on file  . Highest education level: Not on file  Occupational History  . Not on file  Tobacco Use  . Smoking status: Current Every Day Smoker    Years: 20.00    Types: Cigars  . Smokeless tobacco: Former Neurosurgeon    Types: Chew  Substance and Sexual Activity  . Alcohol use: Yes    Alcohol/week: 14.0 standard drinks    Types: 14 Shots of liquor per week    Comment: daily  . Drug use: No  . Sexual activity: Not on file  Other Topics Concern  . Not on file  Social History Narrative  . Not on file   Social Determinants of Health   Financial Resource Strain: Not on file  Food Insecurity: Not on file  Transportation Needs: Not on file  Physical Activity: Not on file  Stress: Not on file  Social Connections: Not on file     Family  History: The patient's family history includes Emphysema in his mother; Leukemia in his paternal grandmother.  ROS:   Please see the history of present illness.     All other systems reviewed and are negative.  EKGs/Labs/Other Studies Reviewed:     EKG:  EKG is  ordered today.  The ekg ordered today demonstrates sinus rhythm 93 no other changes  Recent Labs: 12/31/2020: ALT 43; BUN 13; Creatinine, Ser 0.63; Hemoglobin 14.6; Platelets 165; Potassium 3.7; Sodium 135  Recent Lipid Panel    Component Value Date/Time   CHOL 118 05/06/2020 0802   TRIG 92 05/06/2020 0802   HDL 62 05/06/2020 0802   CHOLHDL 1.9 05/06/2020 0802   CHOLHDL 2.7 09/28/2014 0510   VLDL 22 09/28/2014 0510   LDLCALC 39 05/06/2020 0802   LDLDIRECT 44 05/06/2020 0802     Risk Assessment/Calculations:      Physical Exam:    VS:  BP 120/70 (BP Location: Left Arm, Patient Position: Sitting, Cuff Size: Normal)   Pulse 67   Ht 6' (1.829 m)   Wt 226 lb (102.5 kg)   SpO2 98%   BMI 30.65 kg/m     Wt Readings from Last 3 Encounters:  01/19/21 226 lb (102.5 kg)  12/29/20 221 lb (100.2 kg)  01/15/20 217 lb 3.2 oz (98.5 kg)     GEN:  Well nourished, well developed in no acute distress HEENT: Normal NECK: No JVD; No carotid bruits LYMPHATICS: No lymphadenopathy CARDIAC: RRR, no murmurs, rubs, gallops RESPIRATORY:  Clear to auscultation without rales, wheezing or rhonchi  ABDOMEN: Soft, non-tender, non-distended MUSCULOSKELETAL:  No edema; No deformity  SKIN: Warm and dry NEUROLOGIC:  Alert and oriented x 3 PSYCHIATRIC:  Normal affect   ASSESSMENT:    1. Coronary artery disease involving native coronary artery of native heart without angina pectoris   2. Type 2 diabetes mellitus with hyperlipidemia (HCC)    PLAN:    In order of problems listed above:  Coronary artery disease -LAD stent 2015 -Doing well. Aspirin, excellent lipid control with Repatha and Crestor.  Diabetes with  hyperlipidemia -Insulin pump. Unfortunately did have another episode of DKA, kinked insulin pump.  Essential hypertension -On losartan 50. Doing well.    Medication Adjustments/Labs and Tests Ordered: Current medicines are reviewed at length with the patient today.  Concerns regarding medicines are outlined above.  No orders of the defined types were placed in this encounter.  No orders of the defined types were placed in this encounter.   There are no Patient Instructions on file for this visit.   Signed, Donato Schultz, MD  01/19/2021 2:58 PM    [facility]

## 2021-01-19 NOTE — Patient Instructions (Signed)
Medication Instructions:  The current medical regimen is effective;  continue present plan and medications.  *If you need a refill on your cardiac medications before your next appointment, please call your pharmacy*  Follow-Up: At [facility], you and your health needs are our priority.  As part of our continuing mission to provide you with exceptional heart care, we have created designated Provider Care Teams.  These Care Teams include your primary Cardiologist (physician) and Advanced Practice Providers (APPs -  Physician Assistants and Nurse Practitioners) who all work together to provide you with the care you need, when you need it.  We recommend signing up for the patient portal called "MyChart".  Sign up information is provided on this After Visit Summary.  MyChart is used to connect with patients for Virtual Visits (Telemedicine).  Patients are able to view lab/test results, encounter notes, upcoming appointments, etc.  Non-urgent messages can be sent to your provider as well.   To learn more about what you can do with MyChart, go to [email or web address].    Your next appointment:   12 month(s)  The format for your next appointment:   In Person  Provider:   Mark Skains, MD   Thank you for choosing [facility]!!      

## 2021-02-16 ENCOUNTER — Other Ambulatory Visit: Payer: Self-pay | Admitting: Cardiology

## 2022-02-16 ENCOUNTER — Other Ambulatory Visit: Payer: Self-pay | Admitting: Cardiology

## 2023-01-30 ENCOUNTER — Other Ambulatory Visit: Payer: Self-pay | Admitting: Pharmacist

## 2023-01-30 MED ORDER — REPATHA SURECLICK 140 MG/ML ~~LOC~~ SOAJ: SUBCUTANEOUS | 11 refills | Status: DC

## 2023-01-30 NOTE — Progress Notes (Signed)
Repatha refill request received from [facility], refill sent in.

## 2023-04-18 ENCOUNTER — Other Ambulatory Visit (HOSPITAL_COMMUNITY): Payer: Self-pay

## 2023-04-24 ENCOUNTER — Other Ambulatory Visit (HOSPITAL_COMMUNITY): Payer: Self-pay

## 2023-04-24 ENCOUNTER — Telehealth: Payer: Self-pay

## 2023-04-24 NOTE — Telephone Encounter (Signed)
Pharmacy Patient Advocate Encounter  Prior Authorization for REPATHA has been approved.    PA# [id number] Effective dates: 04/24/23 through 04/23/24   Haze Rushing, CPhT Pharmacy Patient Advocate Specialist Direct Number: [phone] Fax: [phone]

## 2023-04-24 NOTE — Telephone Encounter (Signed)
Pharmacy Patient Advocate Encounter   Received notification from [facility] that prior authorization for REPATHA is needed.    PA submitted on 04/24/23 Key [id number] Status is pending  Haze Rushing, CPhT Pharmacy Patient Advocate Specialist Direct Number: [phone] Fax: [phone]

## 2024-01-08 ENCOUNTER — Telehealth: Payer: Self-pay | Admitting: Pharmacist

## 2024-01-08 DIAGNOSIS — I2510 Atherosclerotic heart disease of native coronary artery without angina pectoris: Secondary | ICD-10-CM

## 2024-01-08 DIAGNOSIS — E7849 Other hyperlipidemia: Secondary | ICD-10-CM

## 2024-01-08 MED ORDER — REPATHA SURECLICK 140 MG/ML ~~LOC~~ SOAJ: SUBCUTANEOUS | 11 refills | Status: DC

## 2024-01-08 NOTE — Telephone Encounter (Signed)
Rx request for Repatha

## 2024-02-22 ENCOUNTER — Ambulatory Visit: Admitting: Podiatry

## 2024-02-22 ENCOUNTER — Encounter: Payer: Self-pay | Admitting: Podiatry

## 2024-02-22 DIAGNOSIS — R234 Changes in skin texture: Secondary | ICD-10-CM

## 2024-02-22 DIAGNOSIS — M79675 Pain in left toe(s): Secondary | ICD-10-CM

## 2024-02-22 DIAGNOSIS — M79674 Pain in right toe(s): Secondary | ICD-10-CM

## 2024-02-22 DIAGNOSIS — E1059 Type 1 diabetes mellitus with other circulatory complications: Secondary | ICD-10-CM

## 2024-02-22 DIAGNOSIS — L84 Corns and callosities: Secondary | ICD-10-CM

## 2024-02-22 DIAGNOSIS — B351 Tinea unguium: Secondary | ICD-10-CM

## 2024-02-22 MED ORDER — AMMONIUM LACTATE 12 % EX LOTN: 1.0000 | TOPICAL_LOTION | CUTANEOUS | 0 refills | Status: AC | PRN

## 2024-02-22 NOTE — Progress Notes (Signed)
  Subjective:  Patient ID: Ian Houston, male    DOB: September 30, 1961,  MRN: [id number]  Chief Complaint  Patient presents with   Callouses    Rm 15: new pt-bilateral callus, Type 1 diabetic-req late pm appt- Pt is diabetic last A1C 7.0. He has bilateral callouses to great toes and heels that have been occurring for about a year. He states that they cause some discomfort.     63 y.o. male presents with the above complaint. History confirmed with patient. Patient presenting with pain related to dystrophic thickened elongated nails. Patient is unable to trim own nails related to nail dystrophy. Patient does have a history of T1DM.  Last A1c approximately 7.  Patient does have callus present located at the right first toe IPJ and right subfifth metatarsal head causing pain.   Objective:  Physical Exam: warm, good capillary refill, diminished pedal hair growth, decreased skin texture and turgor nail exam onychomycosis of the toenails and dystrophic nails DP pulses palpable, PT pulses palpable, protective sensation intact, and vibratory sensation diminished Left Foot:  Pain with palpation of nails due to elongation and dystrophic growth.  Right Foot: Pain with palpation of nails due to elongation and dystrophic growth.  Painful callus present right subfifth metatarsal head, right first toe head of proximal phalanx plantar medial aspect and fissure present to right first toe plantar aspect  Assessment:   1. Pain due to onychomycosis of toenails of both feet   2. Callus   3. Fissure in skin of right foot   4. 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  #Hyperkeratotic lesions/pre ulcerative calluses present right subfifth metatarsal head, first toe head of proximal phalanx plantar medial aspect right foot All symptomatic hyperkeratoses x 2 separate lesions were safely debrided with a sterile #312  blade to patient's level of comfort without incident. We discussed preventative and palliative care of these lesions including supportive and accommodative shoegear, padding, prefabricated and custom molded accommodative orthoses, use of a pumice stone and lotions/creams daily.  # Right first toe fissure Recommend use of regular hydrating creams and lotions daily. -AmLactin sent to patient's pharmacy  #Onychomycosis with pain  -Nails palliatively debrided as below. -Educated on self-care  Procedure: Nail Debridement Rationale: Pain Type of Debridement: manual, sharp debridement. Instrumentation: Nail nipper, rotary burr. Number of Nails: 10  Return in about 3 months (around 05/24/2024) for Diabetic Foot Care.         Bronwen Betters, DPM [facility] / [facility]

## 2024-02-22 NOTE — Patient Instructions (Signed)

## 2024-03-25 ENCOUNTER — Other Ambulatory Visit (HOSPITAL_COMMUNITY): Payer: Self-pay

## 2024-03-25 ENCOUNTER — Telehealth: Payer: Self-pay | Admitting: Pharmacy Technician

## 2024-03-25 NOTE — Telephone Encounter (Signed)
 Pharmacy Patient Advocate Encounter   Received notification from Onbase that prior authorization for REPATHA  is required/requested.   Insurance verification completed.   The patient is insured through [facility] .   Per test claim: PA required; PA submitted to above mentioned insurance via CoverMyMeds Key/confirmation #/[id number] Status is pending

## 2024-03-25 NOTE — Telephone Encounter (Signed)
 I called [facility] and asked them to fill repatha . They filled it and it's 15.00

## 2024-03-25 NOTE — Telephone Encounter (Signed)
 Pharmacy Patient Advocate Encounter  Received notification from [employer] that Prior Authorization for repatha  has been APPROVED from 03/25/24 to 04/24/25. Ran test claim, Copay is $45.00- one month . This test claim was processed through [facility]- copay amounts may vary at other pharmacies due to pharmacy/plan contracts, or as the patient moves through the different stages of their insurance plan.   PA #/Case ID/Reference #: [id number]

## 2024-05-30 ENCOUNTER — Ambulatory Visit: Admitting: Podiatry

## 2024-12-05 ENCOUNTER — Other Ambulatory Visit: Payer: Self-pay | Admitting: Cardiology

## 2024-12-05 DIAGNOSIS — I2510 Atherosclerotic heart disease of native coronary artery without angina pectoris: Secondary | ICD-10-CM

## 2024-12-05 DIAGNOSIS — E7849 Other hyperlipidemia: Secondary | ICD-10-CM

## 2025-01-03 ENCOUNTER — Other Ambulatory Visit: Payer: Self-pay | Admitting: Cardiology

## 2025-01-03 DIAGNOSIS — I2510 Atherosclerotic heart disease of native coronary artery without angina pectoris: Secondary | ICD-10-CM

## 2025-01-03 DIAGNOSIS — E7849 Other hyperlipidemia: Secondary | ICD-10-CM
